# Patient Record
Sex: Female | Born: 1965 | Hispanic: No | Marital: Single | Smoking: Current every day smoker
Health system: Southern US, Community
[De-identification: ages and names within clinical notes are randomized; demographics above are authoritative.]

## PROBLEM LIST (undated history)

## (undated) DIAGNOSIS — K449 Diaphragmatic hernia without obstruction or gangrene: Secondary | ICD-10-CM

## (undated) DIAGNOSIS — F419 Anxiety disorder, unspecified: Secondary | ICD-10-CM

## (undated) DIAGNOSIS — F32A Depression, unspecified: Secondary | ICD-10-CM

## (undated) DIAGNOSIS — K219 Gastro-esophageal reflux disease without esophagitis: Secondary | ICD-10-CM

## (undated) DIAGNOSIS — E785 Hyperlipidemia, unspecified: Secondary | ICD-10-CM

## (undated) HISTORY — DX: Hyperlipidemia, unspecified: E78.5

## (undated) HISTORY — DX: Gastro-esophageal reflux disease without esophagitis: K21.9

## (undated) HISTORY — PX: INCONTINENCE SURGERY: SHX676

## (undated) HISTORY — DX: Anxiety disorder, unspecified: F41.9

## (undated) HISTORY — PX: TONSILLECTOMY: SUR1361

## (undated) HISTORY — DX: Depression, unspecified: F32.A

## (undated) HISTORY — PX: OTHER SURGICAL HISTORY: SHX169

---

## 1998-09-07 ENCOUNTER — Other Ambulatory Visit: Admission: RE | Admit: 1998-09-07 | Discharge: 1998-09-07 | Payer: Self-pay | Admitting: Otolaryngology

## 2000-12-26 ENCOUNTER — Encounter (INDEPENDENT_AMBULATORY_CARE_PROVIDER_SITE_OTHER): Payer: Self-pay | Admitting: Specialist

## 2000-12-26 ENCOUNTER — Other Ambulatory Visit: Admission: RE | Admit: 2000-12-26 | Discharge: 2000-12-26 | Payer: Self-pay | Admitting: Otolaryngology

## 2001-04-09 ENCOUNTER — Emergency Department (HOSPITAL_COMMUNITY): Admission: EM | Admit: 2001-04-09 | Discharge: 2001-04-10 | Payer: Self-pay | Admitting: *Deleted

## 2001-07-15 ENCOUNTER — Emergency Department (HOSPITAL_COMMUNITY): Admission: EM | Admit: 2001-07-15 | Discharge: 2001-07-15 | Payer: Self-pay | Admitting: Emergency Medicine

## 2001-08-14 ENCOUNTER — Other Ambulatory Visit: Admission: RE | Admit: 2001-08-14 | Discharge: 2001-08-14 | Payer: Self-pay | Admitting: Internal Medicine

## 2002-11-06 ENCOUNTER — Encounter: Admission: RE | Admit: 2002-11-06 | Discharge: 2002-11-06 | Payer: Self-pay | Admitting: Internal Medicine

## 2002-11-06 ENCOUNTER — Encounter: Payer: Self-pay | Admitting: Internal Medicine

## 2002-12-09 ENCOUNTER — Ambulatory Visit (HOSPITAL_COMMUNITY): Admission: RE | Admit: 2002-12-09 | Discharge: 2002-12-09 | Payer: Self-pay | Admitting: Internal Medicine

## 2002-12-09 ENCOUNTER — Encounter: Payer: Self-pay | Admitting: Internal Medicine

## 2003-08-04 ENCOUNTER — Ambulatory Visit (HOSPITAL_COMMUNITY): Admission: RE | Admit: 2003-08-04 | Discharge: 2003-08-04 | Payer: Self-pay | Admitting: Internal Medicine

## 2004-04-07 ENCOUNTER — Emergency Department (HOSPITAL_COMMUNITY): Admission: EM | Admit: 2004-04-07 | Discharge: 2004-04-07 | Payer: Self-pay | Admitting: Emergency Medicine

## 2005-05-15 ENCOUNTER — Emergency Department (HOSPITAL_COMMUNITY): Admission: EM | Admit: 2005-05-15 | Discharge: 2005-05-16 | Payer: Self-pay | Admitting: Emergency Medicine

## 2006-02-14 ENCOUNTER — Emergency Department (HOSPITAL_COMMUNITY): Admission: RE | Admit: 2006-02-14 | Discharge: 2006-02-14 | Payer: Self-pay | Admitting: Preventative Medicine

## 2007-09-23 ENCOUNTER — Ambulatory Visit (HOSPITAL_COMMUNITY): Admission: RE | Admit: 2007-09-23 | Discharge: 2007-09-23 | Payer: Self-pay | Admitting: Family Medicine

## 2007-09-25 ENCOUNTER — Ambulatory Visit (HOSPITAL_COMMUNITY): Admission: RE | Admit: 2007-09-25 | Discharge: 2007-09-25 | Payer: Self-pay | Admitting: Family Medicine

## 2008-08-21 ENCOUNTER — Emergency Department (HOSPITAL_COMMUNITY): Admission: EM | Admit: 2008-08-21 | Discharge: 2008-08-21 | Payer: Self-pay | Admitting: Emergency Medicine

## 2009-08-22 ENCOUNTER — Ambulatory Visit (HOSPITAL_COMMUNITY): Admission: RE | Admit: 2009-08-22 | Discharge: 2009-08-22 | Payer: Self-pay | Admitting: Internal Medicine

## 2009-10-04 ENCOUNTER — Ambulatory Visit (HOSPITAL_COMMUNITY): Admission: RE | Admit: 2009-10-04 | Discharge: 2009-10-04 | Payer: Self-pay | Admitting: Obstetrics and Gynecology

## 2009-12-27 ENCOUNTER — Emergency Department (HOSPITAL_COMMUNITY): Admission: EM | Admit: 2009-12-27 | Discharge: 2009-12-27 | Payer: Self-pay | Admitting: Emergency Medicine

## 2010-11-16 LAB — CBC
HCT: 40 % (ref 36.0–46.0)
Hemoglobin: 12.9 g/dL (ref 12.0–15.0)
MCHC: 32.4 g/dL (ref 30.0–36.0)
MCV: 75.5 fL — ABNORMAL LOW (ref 78.0–100.0)
Platelets: 387 10*3/uL (ref 150–400)
RBC: 5.3 MIL/uL — ABNORMAL HIGH (ref 3.87–5.11)
RDW: 16.5 % — ABNORMAL HIGH (ref 11.5–15.5)
WBC: 12.5 10*3/uL — ABNORMAL HIGH (ref 4.0–10.5)

## 2010-11-16 LAB — PREGNANCY, URINE: Preg Test, Ur: NEGATIVE

## 2011-06-24 ENCOUNTER — Other Ambulatory Visit: Payer: Self-pay

## 2011-06-24 ENCOUNTER — Emergency Department (HOSPITAL_COMMUNITY): Payer: Self-pay

## 2011-06-24 ENCOUNTER — Encounter: Payer: Self-pay | Admitting: *Deleted

## 2011-06-24 ENCOUNTER — Emergency Department (HOSPITAL_COMMUNITY)
Admission: EM | Admit: 2011-06-24 | Discharge: 2011-06-24 | Disposition: A | Discharge status: Home / Self Care | Payer: Self-pay | Attending: Emergency Medicine | Admitting: Emergency Medicine

## 2011-06-24 DIAGNOSIS — S5420XA Injury of radial nerve at forearm level, unspecified arm, initial encounter: Secondary | ICD-10-CM | POA: Insufficient documentation

## 2011-06-24 DIAGNOSIS — G43909 Migraine, unspecified, not intractable, without status migrainosus: Secondary | ICD-10-CM | POA: Insufficient documentation

## 2011-06-24 DIAGNOSIS — X58XXXA Exposure to other specified factors, initial encounter: Secondary | ICD-10-CM | POA: Insufficient documentation

## 2011-06-24 DIAGNOSIS — F17200 Nicotine dependence, unspecified, uncomplicated: Secondary | ICD-10-CM | POA: Insufficient documentation

## 2011-06-24 DIAGNOSIS — R000 Tachycardia, unspecified: Secondary | ICD-10-CM | POA: Insufficient documentation

## 2011-06-24 DIAGNOSIS — S42309A Unspecified fracture of shaft of humerus, unspecified arm, initial encounter for closed fracture: Secondary | ICD-10-CM | POA: Insufficient documentation

## 2011-06-24 DIAGNOSIS — K449 Diaphragmatic hernia without obstruction or gangrene: Secondary | ICD-10-CM | POA: Insufficient documentation

## 2011-06-24 HISTORY — DX: Diaphragmatic hernia without obstruction or gangrene: K44.9

## 2011-06-24 MED ORDER — SODIUM CHLORIDE 0.9 % IV SOLN
Freq: Once | INTRAVENOUS | Status: AC
  Administered 2011-06-24: 1000 mL via INTRAVENOUS

## 2011-06-24 MED ORDER — HYDROMORPHONE HCL 1 MG/ML IJ SOLN
1.0000 mg | Freq: Once | INTRAMUSCULAR | Status: AC
  Administered 2011-06-24: 1 mg via INTRAVENOUS

## 2011-06-24 MED ORDER — HYDROMORPHONE HCL 4 MG PO TABS: 4.0000 mg | ORAL_TABLET | ORAL | Status: DC | PRN

## 2011-06-24 MED ORDER — HYDROMORPHONE HCL 1 MG/ML IJ SOLN
INTRAMUSCULAR | Status: AC
  Administered 2011-06-24: 1 mg via INTRAVENOUS
  Filled 2011-06-24: qty 1

## 2011-06-24 MED ORDER — ONDANSETRON HCL 4 MG/2ML IJ SOLN
4.0000 mg | Freq: Once | INTRAMUSCULAR | Status: AC
  Administered 2011-06-24: 4 mg via INTRAVENOUS

## 2011-06-24 MED ORDER — ONDANSETRON HCL 4 MG/2ML IJ SOLN
INTRAMUSCULAR | Status: AC
  Filled 2011-06-24: qty 2

## 2011-06-24 MED ORDER — HYDROMORPHONE HCL 2 MG/ML IJ SOLN
INTRAMUSCULAR | Status: AC
  Administered 2011-06-24: 2 mg via INTRAVENOUS
  Filled 2011-06-24: qty 1

## 2011-06-24 NOTE — ED Notes (Signed)
Up to bathroom with assist. Pt tolerated well. Dressed in pt gown and paper scrub pants. Pulse rate remains high. Will infuse remainder of ns liter prior to discharge

## 2011-06-24 NOTE — ED Notes (Signed)
Family at bedside.pulses remain pos left wrist. Fresh ice bag applied to upper arm. Pt now more comfortable s/p dilaudid 2mg 

## 2011-06-24 NOTE — ED Notes (Signed)
Pt reports she had a coughing episode and "passed" out, pt fell injuring left arm, obvious deformity to left upper arm noted, PMS intact

## 2011-06-24 NOTE — ED Notes (Signed)
Ice has been applied to arm upon admission and remains on now. Pt has been undressed with minimal movement of arm. Pulses remain intact. Sensation good in fingers. Still unable to extend wrist. Per ERMD will wait to splint until consult with ortho.

## 2011-06-24 NOTE — ED Notes (Signed)
Fairly comfortable sitting in wc, groggy s/p medications. Pulse rate decreased to 120's and BP stable. Circ/sensation good in all fingers. Pulses pos in wrist. Pt ready to go home. Given detailed written and verbal instructions, will be staying @ her mom's home today. Will fill rx this morning. Will call ortho on mon morning.

## 2011-06-24 NOTE — ED Notes (Addendum)
Iv infusing well, pulse rate 127. Pt sitting in w/c fairly comfortable.

## 2011-06-24 NOTE — ED Provider Notes (Addendum)
History     CSN: [id number] Arrival date & time: 06/24/2011 12:59 AM   First MD Initiated Contact with Patient 06/24/11 0101      Chief Complaint  Patient presents with  . Arm Injury    (Consider location/radiation/quality/duration/timing/severity/associated sxs/prior treatment) HPI This is a 45 year old female with a history of cough-induced vagal syncope. She had a typical episode occur at a Halloween party just prior to arrival. When she fell she injured her left upper arm. There is a deformity and pseudarthrosis of the left upper arm with moderate to severe pain. The pain is worse with movement or palpation. She also has inability to extend the left hand or fingers. She denies other injury.  Past Medical History  Diagnosis Date  . Migraine   . Hiatal hernia     Past Surgical History  Procedure Date  . Tonsillectomy     No family history on file.  History  Substance Use Topics  . Smoking status: Current Everyday Smoker  . Smokeless tobacco: Not on file  . Alcohol Use: Yes    OB History    Grav Para Term Preterm Abortions TAB SAB Ect Mult Living                  Review of Systems  All other systems reviewed and are negative.    Allergies  Review of patient's allergies indicates no known allergies.  Home Medications   Current Outpatient Rx  Name Route Sig Dispense Refill  . CELEXA PO Oral Take by mouth.      . OMEPRAZOLE 40 MG PO CPDR Oral Take 40 mg by mouth daily.        BP 98/57  Pulse 108  Temp(Src) 98.1 F (36.7 C) (Oral)  Resp 14  Ht 5\' 4"  (1.626 m)  Wt 190 lb (86.183 kg)  BMI 32.61 kg/m2  SpO2 95%  LMP 06/24/2011  Physical Exam General: Well-developed, well-nourished female in no acute distress; appearance consistent with age of record HENT: normocephalic, atraumatic Eyes: Normal Neck: supple Heart: regular rate and rhythm; tachycardic Lungs: clear to auscultation bilaterally Abdomen: soft; nontender; nondistended Extremities:  Deformity and pseudarthrosis of the left upper arm consistent with an humeral shaft fracture; left upper extremity radial and ulnar pulses are +2; sensation is intact over the left forearm and hand with subjective paresthesias dorsally; patient able to grip with the left hand but unable to extend the fingers of the left hand or the left hand at the wrist Neurologic: Awake, alert and oriented;motor function intact in all extremities and symmetric excepting the radial nerve deficit noted above; no facial droop Skin: Warm and dry Psychiatric: Normal mood and affect    ED Course  SPLINT APPLICATION Date/Time: 06/24/2011 3:31 AM Performed by: Hanley Seamen Authorized by: Hanley Seamen Location: left upper arm. Splint type: coaptation. Supplies used: cotton padding, elastic bandage and Ortho-Glass Post-procedure: The splinted body part was neurovascularly unchanged following the procedure. Patient tolerance: Patient tolerated the procedure well with no immediate complications.   (including critical care time)    MDM   EKG Interpretation:  Date & Time: 06/24/2011 1:16 AM  Rate: 117  Rhythm: sinus tachycardia  QRS Axis: normal  Intervals: normal  ST/T Wave abnormalities: normal  Conduction Disutrbances:none  Narrative Interpretation:   Old EKG Reviewed: none available  Nursing notes and vitals signs, including pulse oximetry, reviewed.  Summary of this visit's results, reviewed by myself:   Imaging Studies: Dg Humerus Left  06/24/2011  *  RADIOLOGY REPORT*  Clinical Data: Larey Seat and injured left upper arm.  PORTABLE LEFT HUMERUS - 2+ VIEW 06/24/2011:  Comparison: None.  Findings: Comminuted fracture involving the proximal metadiaphysis with slight medial displacement the distal fragment.  Visualized shoulder joint and elbow joint intact.  IMPRESSION: Comminuted fracture involving the proximal humeral metadiaphysis.  Original Report Authenticated By: Arnell Sieving, M.D.   4:53  AM Patient getting second liter of normal saline due to tachycardia. Patient's blood pressure has been within normal limits. The patient's care was discussed with Dr. Victorino Dike of [facility] who will see her in his office tomorrow.            Hanley Seamen, MD 06/24/11 0454  Hanley Seamen, MD 06/24/11 [phone]

## 2011-06-26 ENCOUNTER — Encounter (HOSPITAL_COMMUNITY): Payer: Self-pay | Admitting: Pharmacy Technician

## 2011-06-27 ENCOUNTER — Encounter (HOSPITAL_COMMUNITY)
Admission: RE | Admit: 2011-06-27 | Discharge: 2011-06-27 | Disposition: A | Discharge status: Home / Self Care | Payer: Self-pay | Source: Ambulatory Visit | Attending: Orthopedic Surgery | Admitting: Orthopedic Surgery

## 2011-06-27 ENCOUNTER — Other Ambulatory Visit (HOSPITAL_COMMUNITY): Payer: Self-pay | Admitting: Orthopedic Surgery

## 2011-06-27 ENCOUNTER — Ambulatory Visit (HOSPITAL_COMMUNITY)
Admission: RE | Admit: 2011-06-27 | Discharge: 2011-06-27 | Disposition: A | Discharge status: Home / Self Care | Payer: Self-pay | Source: Ambulatory Visit | Attending: Orthopedic Surgery | Admitting: Orthopedic Surgery

## 2011-06-27 DIAGNOSIS — S42309A Unspecified fracture of shaft of humerus, unspecified arm, initial encounter for closed fracture: Secondary | ICD-10-CM

## 2011-06-27 DIAGNOSIS — Z01818 Encounter for other preprocedural examination: Secondary | ICD-10-CM | POA: Insufficient documentation

## 2011-06-27 DIAGNOSIS — Z01812 Encounter for preprocedural laboratory examination: Secondary | ICD-10-CM | POA: Insufficient documentation

## 2011-06-27 LAB — COMPREHENSIVE METABOLIC PANEL
ALT: 15 U/L (ref 0–35)
Albumin: 3.4 g/dL — ABNORMAL LOW (ref 3.5–5.2)
Alkaline Phosphatase: 66 U/L (ref 39–117)
BUN: 9 mg/dL (ref 6–23)
Calcium: 9.4 mg/dL (ref 8.4–10.5)
Potassium: 3.6 mEq/L (ref 3.5–5.1)
Sodium: 139 mEq/L (ref 135–145)
Total Protein: 7 g/dL (ref 6.0–8.3)

## 2011-06-27 LAB — CBC
MCHC: 30.8 g/dL (ref 30.0–36.0)
Platelets: 352 10*3/uL (ref 150–400)
RDW: 15 % (ref 11.5–15.5)
WBC: 6.6 10*3/uL (ref 4.0–10.5)

## 2011-06-27 LAB — DIFFERENTIAL
Basophils Absolute: 0.1 10*3/uL (ref 0.0–0.1)
Basophils Relative: 1 % (ref 0–1)
Eosinophils Absolute: 0.3 10*3/uL (ref 0.0–0.7)
Eosinophils Relative: 5 % (ref 0–5)

## 2011-06-27 LAB — SURGICAL PCR SCREEN: MRSA, PCR: NEGATIVE

## 2011-06-30 ENCOUNTER — Ambulatory Visit (HOSPITAL_COMMUNITY)
Admission: RE | Admit: 2011-06-30 | Discharge: 2011-07-01 | Disposition: A | Discharge status: Home / Self Care | Payer: Self-pay | Source: Ambulatory Visit | Attending: Orthopedic Surgery | Admitting: Orthopedic Surgery

## 2011-06-30 DIAGNOSIS — Z87891 Personal history of nicotine dependence: Secondary | ICD-10-CM | POA: Insufficient documentation

## 2011-06-30 DIAGNOSIS — S5420XA Injury of radial nerve at forearm level, unspecified arm, initial encounter: Secondary | ICD-10-CM | POA: Insufficient documentation

## 2011-06-30 DIAGNOSIS — S42309A Unspecified fracture of shaft of humerus, unspecified arm, initial encounter for closed fracture: Secondary | ICD-10-CM | POA: Insufficient documentation

## 2011-06-30 DIAGNOSIS — W19XXXA Unspecified fall, initial encounter: Secondary | ICD-10-CM | POA: Insufficient documentation

## 2011-06-30 DIAGNOSIS — G43909 Migraine, unspecified, not intractable, without status migrainosus: Secondary | ICD-10-CM

## 2011-06-30 DIAGNOSIS — Z01818 Encounter for other preprocedural examination: Secondary | ICD-10-CM | POA: Insufficient documentation

## 2011-06-30 DIAGNOSIS — J449 Chronic obstructive pulmonary disease, unspecified: Secondary | ICD-10-CM | POA: Insufficient documentation

## 2011-06-30 DIAGNOSIS — J4489 Other specified chronic obstructive pulmonary disease: Secondary | ICD-10-CM | POA: Insufficient documentation

## 2011-06-30 DIAGNOSIS — E669 Obesity, unspecified: Secondary | ICD-10-CM | POA: Insufficient documentation

## 2011-06-30 DIAGNOSIS — K449 Diaphragmatic hernia without obstruction or gangrene: Secondary | ICD-10-CM

## 2011-06-30 DIAGNOSIS — F17200 Nicotine dependence, unspecified, uncomplicated: Secondary | ICD-10-CM

## 2011-06-30 DIAGNOSIS — Z01812 Encounter for preprocedural laboratory examination: Secondary | ICD-10-CM | POA: Insufficient documentation

## 2011-06-30 MED ORDER — CITALOPRAM HYDROBROMIDE 20 MG PO TABS
20.0000 mg | ORAL_TABLET | Freq: Every day | ORAL | Status: DC
  Administered 2011-07-01: 20 mg via ORAL
  Filled 2011-06-30: qty 1

## 2011-06-30 MED ORDER — VITAMIN C 500 MG PO TABS
500.0000 mg | ORAL_TABLET | Freq: Every day | ORAL | Status: DC
  Administered 2011-07-01: 500 mg via ORAL
  Filled 2011-06-30: qty 1

## 2011-06-30 MED ORDER — LACTATED RINGERS IV SOLN
INTRAVENOUS | Status: DC
  Administered 2011-07-01: 02:00:00 via INTRAVENOUS

## 2011-06-30 MED ORDER — LORATADINE 10 MG PO TABS
10.0000 mg | ORAL_TABLET | Freq: Every day | ORAL | Status: DC
  Administered 2011-07-01: 10 mg via ORAL
  Filled 2011-06-30: qty 1

## 2011-06-30 MED ORDER — DIPHENHYDRAMINE HCL 25 MG PO CAPS: 25.0000 mg | ORAL_CAPSULE | ORAL | Status: DC | PRN

## 2011-06-30 MED ORDER — HYDROMORPHONE HCL 2 MG PO TABS
2.0000 mg | ORAL_TABLET | ORAL | Status: DC | PRN
  Administered 2011-07-01: 4 mg via ORAL

## 2011-06-30 MED ORDER — ONDANSETRON HCL 4 MG/2ML IJ SOLN: 4.0000 mg | Freq: Four times a day (QID) | INTRAMUSCULAR | Status: DC | PRN

## 2011-06-30 MED ORDER — DOCUSATE SODIUM 100 MG PO CAPS
100.0000 mg | ORAL_CAPSULE | Freq: Two times a day (BID) | ORAL | Status: DC
  Administered 2011-07-01: 100 mg via ORAL

## 2011-06-30 MED ORDER — METHOCARBAMOL 500 MG PO TABS
500.0000 mg | ORAL_TABLET | Freq: Four times a day (QID) | ORAL | Status: DC | PRN
  Administered 2011-07-01: 500 mg via ORAL

## 2011-06-30 MED ORDER — ONDANSETRON HCL 4 MG PO TABS: 8.0000 mg | ORAL_TABLET | Freq: Four times a day (QID) | ORAL | Status: DC | PRN

## 2011-06-30 MED ORDER — HYDROMORPHONE HCL PF 1 MG/ML IJ SOLN: 0.5000 mg | INTRAMUSCULAR | Status: DC | PRN

## 2011-06-30 MED ORDER — CEFAZOLIN SODIUM 1-5 GM-% IV SOLN
1.0000 g | Freq: Three times a day (TID) | INTRAVENOUS | Status: DC
  Administered 2011-07-01: 1 g via INTRAVENOUS
  Filled 2011-06-30 (×3): qty 50

## 2011-06-30 MED ORDER — PANTOPRAZOLE SODIUM 40 MG PO TBEC
40.0000 mg | DELAYED_RELEASE_TABLET | Freq: Every day | ORAL | Status: DC
  Administered 2011-07-01: 40 mg via ORAL

## 2011-06-30 MED ORDER — MORPHINE SULFATE 2 MG/ML IJ SOLN: 2.0000 mg | INTRAMUSCULAR | Status: DC | PRN

## 2011-06-30 MED ORDER — METHOCARBAMOL 100 MG/ML IJ SOLN
500.0000 mg | Freq: Four times a day (QID) | INTRAVENOUS | Status: DC | PRN
  Filled 2011-06-30: qty 5

## 2011-06-30 MED ORDER — OXYCODONE HCL 5 MG PO TABS
5.0000 mg | ORAL_TABLET | ORAL | Status: DC | PRN
  Administered 2011-07-01 (×2): 10 mg via ORAL

## 2011-07-01 MED ORDER — HYDROMORPHONE HCL 2 MG PO TABS: 2.0000 mg | ORAL_TABLET | ORAL | Status: DC | PRN

## 2011-07-01 MED ORDER — HYDROMORPHONE HCL 2 MG PO TABS: 2.0000 mg | ORAL_TABLET | ORAL | Status: AC | PRN

## 2011-07-01 MED ORDER — METHOCARBAMOL 500 MG PO TABS: 500.0000 mg | ORAL_TABLET | Freq: Four times a day (QID) | ORAL | Status: AC

## 2011-07-01 NOTE — Progress Notes (Signed)
Orthopedic Tech Progress Note Patient Details:  Tammie Sweeney Mar 10, 1966 [id number]  Other Ortho Devices Type of Ortho Device: Other (comment) Ortho Device Location: shoulder immobilizer sling Ortho Device Interventions: Application   [name] 07/01/2011, 2:16 PM

## 2011-07-01 NOTE — Progress Notes (Signed)
No PT needs.  Pt is independent with mobility.  OT to address all needs.  PT sign off.

## 2011-07-01 NOTE — Progress Notes (Signed)
Occupational Therapy Evaluation Patient Details Name: Tammie Sweeney MRN: [id number] DOB: 04-Jan-1966 Today's Date: 07/01/2011  Problem List: There is no problem list on file for this patient.   Past Medical History:  Past Medical History  Diagnosis Date  . Migraine   . Hiatal hernia    Past Surgical History:  Past Surgical History  Procedure Date  . Tonsillectomy     OT Assessment/Plan/Recommendation OT Assessment OT Recommendation/Assessment: Patient does not need any further OT services (Progress with OT per MD after follow up appt. with MD) OT Goals    OT Evaluation Precautions/Restrictions  Precautions Precautions: Shoulder Type of Shoulder Precautions: Sling with ambulation, ADLs, Only AROM fingers Precaution Booklet Issued: No Precaution Comments: ADLs, Sling with ambulation, AROM fingers only, elevation of left UE on 1-3 pillows. Required Braces or Orthoses: Yes Other Brace/Splint: Sling Restrictions Weight Bearing Restrictions: Yes LUE Weight Bearing: Non weight bearing Other Position/Activity Restrictions: Sling with ambulation and PRN Prior Functioning Home Living Lives With: Family Receives Help From: Family Type of Home: House Home Layout: One level Home Access: Stairs to enter Secretary/administrator of Steps: 3 Bathroom Shower/Tub: Forensic scientist: Standard Bathroom Accessibility: No Prior Function Level of Independence: Independent with basic ADLs Able to Take Stairs?: Yes Driving: Yes Vocation: Full time employment ADL ADL Eating/Feeding: Simulated;Set up Where Assessed - Eating/Feeding: Edge of bed Grooming: Performed;Wash/dry hands;Set up Where Assessed - Grooming: Standing at sink Upper Body Bathing: Not assessed Lower Body Bathing: Other (comment) ([email or web address] on LB AE of long handled sponge ) Where Assessed - Lower Body Bathing: Sit to stand from bed Upper Body Dressing: Simulated;Set up (Educated on don LUE  sleeve first ) Upper Body Dressing Details (indicate cue type and reason): Mod verbal cues for technique and demonstration Where Assessed - Upper Body Dressing: Unsupported Lower Body Dressing: Simulated;Minimal assistance (Educated and demonstrated on AE use of reacher and sock aide) Lower Body Dressing Details (indicate cue type and reason): Mod verbal cues of technique  Where Assessed - Lower Body Dressing: Sit to stand from bed Toilet Transfer: Performed;Modified independent Toilet Transfer Method: Proofreader: Regular height toilet Toileting - Clothing Manipulation: Simulated;Minimal assistance Where Assessed - Toileting Clothing Manipulation: Sit to stand from 3-in-1 or toilet Toileting - Hygiene: Simulated;Set up (Pt. with increased time to complete hygiene) Toileting - Hygiene Details (indicate cue type and reason): Min verbal cues for technique of using aide to reach backside to perform hygiene Where Assessed - Toileting Hygiene: Sit to stand from 3-in-1 or toilet Tub/Shower Transfer: Not assessed (Pt. educated and demonstrated the technique for transfer) Tub/Shower Transfer Method: Not assessed Psychologist, educational:  (None) Equipment Used: Reacher;Long-handled sponge;Sock aid;Upper extremity splints (LUE wrapped splint ) ADL Comments: Educated pt. on use of plastic wrapping to cover LUE while bathing to maintain coverage and integrity of splint dressings Vision/Perception  Vision - History Baseline Vision: No visual deficits Patient Visual Report: No change from baseline Vision - Assessment Vision Assessment: Vision not tested Perception Perception: Not tested Praxis Praxis: Not tested Cognition Cognition Arousal/Alertness: Awake/alert Overall Cognitive Status: Appears within functional limits for tasks assessed Sensation/Coordination Sensation Light Touch: Not tested Stereognosis: Not tested Hot/Cold: Not tested Proprioception: Not  tested Coordination Gross Motor Movements are Fluid and Coordinated: Yes Fine Motor Movements are Fluid and Coordinated: No (LUE digits with increased swelling and decreased fine motor) Extremity Assessment RUE Assessment RUE Assessment: Within Functional Limits LUE Assessment LUE Assessment: Exceptions to WFL (Only AAROM/AROM of digits to decrease  swelling) Mobility  Bed Mobility Bed Mobility: Yes Supine to Sit: 6: Modified independent (Device/Increase time) (Increased time) Transfers Transfers: Yes Sit to Stand: 6: Modified independent (Device/Increase time) (Increase time) Exercises Hand Exercises Digit Composite Flexion: AROM;10 reps Composite Extension: AAROM;Self ROM (To achieve full extension) Digit Composite Abduction: AROM;10 reps Digit Composite Adduction: AROM;10 reps;Supine Digit Lifts: AAROM;Self ROM;Left;10 reps;Supine Thumb Abduction: AROM;Left;10 reps;Supine Thumb Adduction: AROM;Left;10 reps;Supine Opposition: AROM;10 reps;Supine -All exercises for LUE hand. End of Session OT - End of Session Equipment Utilized During Treatment: Gait belt (LUE sling) Activity Tolerance: Patient tolerated treatment well Patient left: in bed Nurse Communication: Mobility status for transfers;Mobility status for ambulation General Behavior During Session: [facility] for tasks performed Cognition: [facility] for tasks performed   Ayeisha Lindenberger 07/01/2011, 10:12 AM

## 2011-07-01 NOTE — Discharge Summary (Signed)
  See dictation [phone]

## 2011-07-02 NOTE — Discharge Summary (Signed)
NAMEMERCED, BROUGHAM                 ACCOUNT NO.:  [id number]  MEDICAL RECORD NO.:  [id number]  LOCATION:  5019                         FACILITY:  [facility]  PHYSICIAN:  Dionne Ano. Reneka Nebergall, M.D.DATE OF BIRTH:  1965/11/25  DATE OF ADMISSION:  06/30/2011 DATE OF DISCHARGE:  07/01/2011                              DISCHARGE SUMMARY   Tammie Sweeney was admitted to the hospital on June 30, 2011, for open reduction and internal fixation for her left displaced comminuted humerus fracture.  She was taken to the operative arena on that day. Preoperatively, she was counseled in regard to risks and benefits.  She underwent successful open reduction and internal fixation and radial nerve decompression and neurolysis.  Her radial nerve was highly contused and intact and she did very well.  Postoperatively, she was matriculated in long-hour standard post-op algorithm.  Postop day 1, she was awake, alert, and oriented, vital signs are stable.  She had no complicating features.  She had a radial nerve palsy, we are going to watch this very closely.  She understands that it might take 6 months to a year for this to return; however, given the state of affairs, the nerve and her early return to function, I do think it is going to come back sooner and so does [name].  We are going to ask her maintain healthy living lifestyle and no smoking, see me in the office as her discharge summary outlines.  At the time of discharge, she was awake, alert, and oriented, in no acute distress.  Vital signs are stable.  She had equal chest expansion.  No shortness of breath.  Abdomen nontender.  Walking with a nonantalgic gait.  Left upper extremity was stable.  There were no complicating features.  We will look forward to seeing her back.     Dionne Ano. Amanda Pea, M.D.     [facility]  D:  07/01/2011  T:  07/01/2011  Job:  [id number]

## 2011-07-03 MED FILL — Chlorhexidine Gluconate Liquid 4%: CUTANEOUS | Qty: 118 | Status: AC

## 2011-07-03 MED FILL — Cefazolin Sodium For Inj 1 GM: INTRAMUSCULAR | Qty: 20 | Status: AC

## 2011-07-03 MED FILL — Dextrose Inj 5%: INTRAVENOUS | Qty: 50 | Status: AC

## 2011-07-31 ENCOUNTER — Ambulatory Visit (HOSPITAL_COMMUNITY): Payer: Self-pay | Admitting: Physical Therapy

## 2011-08-02 ENCOUNTER — Ambulatory Visit (HOSPITAL_COMMUNITY)
Admission: RE | Admit: 2011-08-02 | Discharge: 2011-08-02 | Disposition: A | Discharge status: Home / Self Care | Payer: Self-pay | Source: Ambulatory Visit | Attending: Orthopedic Surgery | Admitting: Orthopedic Surgery

## 2011-08-02 DIAGNOSIS — M25539 Pain in unspecified wrist: Secondary | ICD-10-CM | POA: Insufficient documentation

## 2011-08-02 DIAGNOSIS — S42309A Unspecified fracture of shaft of humerus, unspecified arm, initial encounter for closed fracture: Secondary | ICD-10-CM

## 2011-08-02 DIAGNOSIS — IMO0001 Reserved for inherently not codable concepts without codable children: Secondary | ICD-10-CM | POA: Insufficient documentation

## 2011-08-02 DIAGNOSIS — M25639 Stiffness of unspecified wrist, not elsewhere classified: Secondary | ICD-10-CM | POA: Insufficient documentation

## 2011-08-02 DIAGNOSIS — G5630 Lesion of radial nerve, unspecified upper limb: Secondary | ICD-10-CM | POA: Insufficient documentation

## 2011-08-02 DIAGNOSIS — Z4689 Encounter for fitting and adjustment of other specified devices: Secondary | ICD-10-CM | POA: Insufficient documentation

## 2011-08-02 DIAGNOSIS — M6281 Muscle weakness (generalized): Secondary | ICD-10-CM | POA: Insufficient documentation

## 2011-08-02 HISTORY — DX: Unspecified fracture of shaft of humerus, unspecified arm, initial encounter for closed fracture: S42.309A

## 2011-08-02 NOTE — Progress Notes (Signed)
Occupational Therapy Evaluation  Patient Details  Name: Tammie Sweeney MRN: [id number] Date of Birth: 09/17/1965  Today's Date: 08/02/2011 Time: 4098-1191 Time Calculation (min): 59 min OT Eval 4782-9562 10' Splinting 1308-6578 48' Visit#: 1  of 4   Re-eval: 08/30/11  Assessment Diagnosis: S/P R Humeral Shaft ORIF and Radial Nerve Palsy Surgical Date: 06/30/11 Next MD Visit: 08/17/11  Past Medical History:  Past Medical History  Diagnosis Date  . Migraine   . Hiatal hernia    Past Surgical History:  Past Surgical History  Procedure Date  . Tonsillectomy     Subjective Symptoms/Limitations Symptoms: S:  I broke my arm and had to have surgery.   Limitations: Tammie Sweeney fell and fractured her right humeral shaft on 06/23/11.  She had surgery the following week to repair the fracture and has been in a soft cast since that time.  She has been referred to occupational therapy for fabrication of a radial nerve splint and a sarmiento brace for her upper arm.  She returns to the MD mid month, and will determine if actual  treatment for range and function can begin at t that point. Special Tests: comment is left upper extremity, not right  Precautions/Restrictions  Precautions Precautions: Shoulder   Assessment RUE Assessment RUE Assessment: Within Functional Limits RUE PROM (degrees) Overall PROM Right Upper Extremity:  (forearm, wrist, hand PROM is WFL.  AROM flex WFL 0 ext)  Exercise/Treatments  Splinting:   began fabrication of left radial nerve splint with phoenix outrigger, to include thumb.  Base is fabricated, and digits 2-5 initiated.  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 Rehab Potential: Good OT Frequency: Min 2X/week OT Duration: 2 weeks OT Treatment/Interventions:  Self-care/ADL training;Patient/family education;Other (comment) (HEP and splinting) OT Plan: P:  Complete radial nerve splint (thumb and digit placement)  measure for sarmiento brace and fit.   Goals Short Term Goals Time to Complete Short Term Goals: 2 weeks Short Term Goal 1: Patient will be educated on wear and care of radial nerve splint and sarmiento brace. Short Term Goal 2: Patient will be I with donning and doffing radial nerve splint and sarmiento brace. End of Session Patient Active Problem List  Diagnoses  . Fracture closed, humerus, shaft  . Radial nerve palsy   End of Session Activity Tolerance: Patient tolerated treatment well General Behavior During Session: [facility] for tasks performed Cognition: [facility] for tasks performed  Time Calculation Start Time: 1526 Stop Time: 1625 Time Calculation (min): 59 min  Shirlean Mylar, OTR/L  08/02/2011, 5:10 PM  Physician Documentation Your signature is required to indicate approval of the treatment plan as stated above.  Please sign and either send electronically or make a copy of this report for your files and return this physician signed original.  Please mark one 1.__approve of plan  2. ___approve of plan with the following conditions.   ______________________________                                                          _____________________ Physician Signature  Date  

## 2011-08-03 ENCOUNTER — Ambulatory Visit (HOSPITAL_COMMUNITY)
Admission: RE | Admit: 2011-08-03 | Discharge: 2011-08-03 | Disposition: A | Discharge status: Home / Self Care | Payer: Self-pay | Source: Ambulatory Visit | Attending: Internal Medicine | Admitting: Internal Medicine

## 2011-08-03 DIAGNOSIS — S42309A Unspecified fracture of shaft of humerus, unspecified arm, initial encounter for closed fracture: Secondary | ICD-10-CM

## 2011-08-03 DIAGNOSIS — G5630 Lesion of radial nerve, unspecified upper limb: Secondary | ICD-10-CM

## 2011-08-03 NOTE — Progress Notes (Signed)
Occupational Therapy Treatment  Patient Details  Name: Tammie Sweeney MRN: [id number] Date of Birth: [birth year]  Today's Date: 08/03/2011 Time: 0865-7846 Time Calculation (min): 35 min Splinting 35' Visit#: 2  of 4   Re-eval: 08/29/10    Subjective Symptoms/Limitations Symptoms: S:  This feels fine. Pain Assessment Currently in Pain?: No/denies  Exercise/Treatments Splinting:  Completed fabrication of left radial nerve splint with phoenix outrigger including thumb.  Patient demonstrated I donning and doffing the splint and was able to grasp her book, her phone, and  a cup with her hand  in the splint.  I instructed her to wear the splint as much as possible during the day and to sleep in the previously issued spliint or a store bought wrist cock up spliint.  I measured her for a short humerus fracture brace ( sarmiento brace).  she will need a medium.  I have placed the order for the brace and will schedule another visit with the patient to fit the brace once it is delivered.  Occupational Therapy Assessment and Plan OT Assessment and Plan Clinical Impression Statement: A:  Completed fabrication of radial nerve splint with phoenix outrigger.  Assessed for size of sarmiento brace. OT Plan: P:  Fit for sarmiento brace once it arrives.   Goals Short Term Goals Time to Complete Short Term Goals: 2 weeks Short Term Goal 1: Patient will be educated on wear and care of radial nerve splint and sarmiento brace. Short Term Goal 1 Progress: Progressing toward goal Short Term Goal 2: Patient will be I with donning and doffing radial nerve splint and sarmiento brace. Short Term Goal 2 Progress: Progressing toward goal End of Session Patient Active Problem List  Diagnoses  . Fracture closed, humerus, shaft  . Radial nerve palsy   End of Session Activity Tolerance: Patient tolerated treatment well General Behavior During Session: [facility] for tasks performed Cognition: [facility] for tasks  performed   Shirlean Mylar, OTR/L  08/03/2011, 4:15 PM

## 2011-08-08 ENCOUNTER — Ambulatory Visit (HOSPITAL_COMMUNITY)
Admission: RE | Admit: 2011-08-08 | Discharge: 2011-08-08 | Disposition: A | Discharge status: Home / Self Care | Payer: Self-pay | Source: Ambulatory Visit | Attending: Internal Medicine | Admitting: Internal Medicine

## 2011-08-08 DIAGNOSIS — G5630 Lesion of radial nerve, unspecified upper limb: Secondary | ICD-10-CM

## 2011-08-08 DIAGNOSIS — S42309A Unspecified fracture of shaft of humerus, unspecified arm, initial encounter for closed fracture: Secondary | ICD-10-CM

## 2011-08-08 NOTE — Progress Notes (Signed)
Occupational Therapy Treatment  Patient Details  Name: Tammie Sweeney MRN: [id number] Date of Birth: 1965/09/17  Today's Date: 08/08/2011 Time: 1410-1430 Time Calculation (min): 20 min Splinting 20' Visit#: 3  of 4   Re-eval: 08/30/11    Subjective Symptoms/Limitations Symptoms: S:  The thumb part came loose.   Exercise/Treatments Splinting:  Refit the long finger pulley for increased extension.  The thumb outrigger had come off completely  - I reaffixed the thumb outrigger.  Short humerus fracture brace fit to patient.  I educated patient on wear and care, donning and doffing of the brace, and contraindications.  Patient was able to don and doff both the radial nerve splint and the short humerus fracture brace [name].  [phone]  Occupational Therapy Assessment and Plan OT Assessment and Plan Clinical Impression Statement: A:  Patient fit for humerus brace and fixed radial nerve splint. OT Plan: P:  Await further orders from MD>   Goals Short Term Goals Time to Complete Short Term Goals: 2 weeks Short Term Goal 1: Patient will be educated on wear and care of radial nerve splint and sarmiento brace. Short Term Goal 1 Progress: Met Short Term Goal 2: Patient will be I with donning and doffing radial nerve splint and sarmiento brace. Short Term Goal 2 Progress: Met End of Session Patient Active Problem List  Diagnoses  . Fracture closed, humerus, shaft  . Radial nerve palsy   End of Session Activity Tolerance: Patient tolerated treatment well General Behavior During Session: [facility] for tasks performed Cognition: [facility] for tasks performed   Shirlean Mylar, OTR/L  08/08/2011, 3:22 PM

## 2012-01-03 ENCOUNTER — Ambulatory Visit (HOSPITAL_COMMUNITY)
Admission: RE | Admit: 2012-01-03 | Discharge: 2012-01-03 | Disposition: A | Discharge status: Home / Self Care | Payer: Self-pay | Source: Ambulatory Visit | Attending: Orthopedic Surgery | Admitting: Orthopedic Surgery

## 2012-01-03 DIAGNOSIS — G5630 Lesion of radial nerve, unspecified upper limb: Secondary | ICD-10-CM

## 2012-01-03 DIAGNOSIS — M6281 Muscle weakness (generalized): Secondary | ICD-10-CM | POA: Insufficient documentation

## 2012-01-03 DIAGNOSIS — S42309A Unspecified fracture of shaft of humerus, unspecified arm, initial encounter for closed fracture: Secondary | ICD-10-CM

## 2012-01-03 DIAGNOSIS — M25539 Pain in unspecified wrist: Secondary | ICD-10-CM | POA: Insufficient documentation

## 2012-01-03 DIAGNOSIS — Z4689 Encounter for fitting and adjustment of other specified devices: Secondary | ICD-10-CM | POA: Insufficient documentation

## 2012-01-03 DIAGNOSIS — IMO0001 Reserved for inherently not codable concepts without codable children: Secondary | ICD-10-CM | POA: Insufficient documentation

## 2012-01-03 NOTE — Evaluation (Signed)
Occupational Therapy Evaluation  Patient Details  Name: Tammie Sweeney MRN: [id number] Date of Birth: 1965-11-26  Today's Date: 01/03/2012 Time: 1478-2956 OT Time Calculation (min): 35 min OT Eval 10' Splinting 25' Visit#: 1  of 1    Assessment Diagnosis: S/P left Humeral Shaft ORIF and Radial Nerve Palsy Surgical Date: 06/30/11 Next MD Visit: 02/25/2012     Past Medical History:  Past Medical History  Diagnosis Date  . Migraine   . Hiatal hernia    Past Surgical History:  Past Surgical History  Procedure Date  . Tonsillectomy     Subjective Symptoms/Limitations Symptoms: S:  He wants me to have a splint that supports my MCPJ. Limitations: Tammie Sweeney fell and fractured her right humeral shaft on 06/23/11. She had surgery one week later to repari the fracture.  She has associated radial nerve palsy.  In December, this therapist fabricated a radial nerve splint and a sarmiento brace for the patient.  She returns today with an order for fabrication of a splint that supports her wrist and MCPJs. Pain Assessment Currently in Pain?: No/denies   Assessment LUE AROM (degrees) LUE Overall AROM Comments: AROM is WFL except for MCPJ extension, which lacks approximately 25% full range.  Strength and coordination testing deferred as this was a one time visit for splinting.      Exercise/Treatments    Splinting Splinting: Fabricated a resting hand splint that positions her left wrist in neutral to slight extension and extends her MCPJ to allow for increased  extension while sleeping.  Patient was educated on wear and care and contraindications.    Occupational Therapy Assessment and Plan OT Assessment and Plan Clinical Impression Statement: A:  Patient presents with [facility] AROM of wrist flexion and extension.  She is able to make a full fist and is able to extend her digits at IPJs.  The MCPJ have 75% extension.   Pt will benefit from skilled therapeutic intervention in order to  improve on the following deficits: Decreased range of motion Rehab Potential: Good OT Frequency: Min 1X/week OT Duration: Other (comment) (1 week) OT Treatment/Interventions: Splinting OT Plan: P:  Await further orders from MD.   Goals Short Term Goals Time to Complete Short Term Goals: 2 weeks Short Term Goal 1: Patient will be educated on wear and care of left resting hand splint.  Problem List Patient Active Problem List  Diagnoses  . Fracture closed, humerus, shaft  . Radial nerve palsy    End of Session Activity Tolerance: Patient tolerated treatment well General Behavior During Session: [facility] for tasks performed Cognition: [facility] for tasks performed  Shirlean Mylar, OTR/L  01/03/2012, 4:42 PM  Physician Documentation Your signature is required to indicate approval of the treatment plan as stated above.  Please sign and either send electronically or make a copy of this report for your files and return this physician signed original.  Please mark one 1.__approve of plan  2. ___approve of plan with the following conditions.   ______________________________                                                          _____________________ Physician Signature  Date  

## 2014-06-28 ENCOUNTER — Other Ambulatory Visit (HOSPITAL_COMMUNITY): Payer: Self-pay | Admitting: Internal Medicine

## 2014-06-28 DIAGNOSIS — Z1231 Encounter for screening mammogram for malignant neoplasm of breast: Secondary | ICD-10-CM

## 2014-07-30 ENCOUNTER — Ambulatory Visit (HOSPITAL_COMMUNITY): Payer: Self-pay

## 2014-08-06 ENCOUNTER — Ambulatory Visit (HOSPITAL_COMMUNITY): Payer: Self-pay

## 2014-08-11 ENCOUNTER — Ambulatory Visit (HOSPITAL_COMMUNITY)
Admission: RE | Admit: 2014-08-11 | Discharge: 2014-08-11 | Disposition: A | Discharge status: Home / Self Care | Payer: BC Managed Care – PPO | Source: Ambulatory Visit | Attending: Internal Medicine | Admitting: Internal Medicine

## 2014-08-11 DIAGNOSIS — Z1231 Encounter for screening mammogram for malignant neoplasm of breast: Secondary | ICD-10-CM

## 2014-12-17 ENCOUNTER — Other Ambulatory Visit (HOSPITAL_COMMUNITY): Payer: Self-pay | Admitting: Internal Medicine

## 2014-12-17 DIAGNOSIS — R1031 Right lower quadrant pain: Secondary | ICD-10-CM

## 2014-12-20 ENCOUNTER — Ambulatory Visit (HOSPITAL_COMMUNITY)
Admission: RE | Admit: 2014-12-20 | Discharge: 2014-12-20 | Disposition: A | Discharge status: Home / Self Care | Payer: BLUE CROSS/BLUE SHIELD | Source: Ambulatory Visit | Attending: Internal Medicine | Admitting: Internal Medicine

## 2014-12-20 DIAGNOSIS — R1031 Right lower quadrant pain: Secondary | ICD-10-CM

## 2014-12-21 ENCOUNTER — Ambulatory Visit (HOSPITAL_COMMUNITY)
Admission: RE | Admit: 2014-12-21 | Discharge: 2014-12-21 | Disposition: A | Discharge status: Home / Self Care | Payer: BLUE CROSS/BLUE SHIELD | Source: Ambulatory Visit | Attending: Internal Medicine | Admitting: Internal Medicine

## 2014-12-21 ENCOUNTER — Encounter (HOSPITAL_COMMUNITY): Payer: Self-pay

## 2014-12-21 ENCOUNTER — Other Ambulatory Visit (HOSPITAL_COMMUNITY): Payer: Self-pay | Admitting: Internal Medicine

## 2014-12-21 DIAGNOSIS — R1031 Right lower quadrant pain: Secondary | ICD-10-CM | POA: Diagnosis not present

## 2014-12-21 MED ORDER — IOHEXOL 300 MG/ML  SOLN
100.0000 mL | Freq: Once | INTRAMUSCULAR | Status: AC | PRN
  Administered 2014-12-21: 100 mL via INTRAVENOUS

## 2015-01-04 ENCOUNTER — Emergency Department (HOSPITAL_COMMUNITY): Payer: BLUE CROSS/BLUE SHIELD

## 2015-01-04 ENCOUNTER — Encounter (HOSPITAL_COMMUNITY): Payer: Self-pay

## 2015-01-04 ENCOUNTER — Emergency Department (HOSPITAL_COMMUNITY)
Admission: EM | Admit: 2015-01-04 | Discharge: 2015-01-04 | Disposition: A | Discharge status: Home / Self Care | Payer: BLUE CROSS/BLUE SHIELD | Attending: Emergency Medicine | Admitting: Emergency Medicine

## 2015-01-04 DIAGNOSIS — Z8719 Personal history of other diseases of the digestive system: Secondary | ICD-10-CM | POA: Insufficient documentation

## 2015-01-04 DIAGNOSIS — Z720 Tobacco use: Secondary | ICD-10-CM | POA: Insufficient documentation

## 2015-01-04 DIAGNOSIS — Z8679 Personal history of other diseases of the circulatory system: Secondary | ICD-10-CM | POA: Diagnosis not present

## 2015-01-04 DIAGNOSIS — Z79899 Other long term (current) drug therapy: Secondary | ICD-10-CM | POA: Insufficient documentation

## 2015-01-04 DIAGNOSIS — R000 Tachycardia, unspecified: Secondary | ICD-10-CM | POA: Diagnosis not present

## 2015-01-04 DIAGNOSIS — R110 Nausea: Secondary | ICD-10-CM | POA: Insufficient documentation

## 2015-01-04 DIAGNOSIS — R630 Anorexia: Secondary | ICD-10-CM | POA: Insufficient documentation

## 2015-01-04 DIAGNOSIS — R1084 Generalized abdominal pain: Secondary | ICD-10-CM | POA: Diagnosis not present

## 2015-01-04 DIAGNOSIS — R109 Unspecified abdominal pain: Secondary | ICD-10-CM | POA: Diagnosis present

## 2015-01-04 DIAGNOSIS — R300 Dysuria: Secondary | ICD-10-CM

## 2015-01-04 LAB — URINALYSIS, ROUTINE W REFLEX MICROSCOPIC
Bilirubin Urine: NEGATIVE
GLUCOSE, UA: NEGATIVE mg/dL
Hgb urine dipstick: NEGATIVE
KETONES UR: NEGATIVE mg/dL
LEUKOCYTES UA: NEGATIVE
NITRITE: NEGATIVE
PH: 5.5 (ref 5.0–8.0)
Protein, ur: NEGATIVE mg/dL
SPECIFIC GRAVITY, URINE: 1.024 (ref 1.005–1.030)
Urobilinogen, UA: 0.2 mg/dL (ref 0.0–1.0)

## 2015-01-04 LAB — COMPREHENSIVE METABOLIC PANEL WITH GFR
ALT: 15 U/L (ref 14–54)
AST: 14 U/L — ABNORMAL LOW (ref 15–41)
Albumin: 3.8 g/dL (ref 3.5–5.0)
Alkaline Phosphatase: 52 U/L (ref 38–126)
Anion gap: 5 (ref 5–15)
BUN: 15 mg/dL (ref 6–20)
CO2: 25 mmol/L (ref 22–32)
Calcium: 9.3 mg/dL (ref 8.9–10.3)
Chloride: 108 mmol/L (ref 101–111)
Creatinine, Ser: 0.86 mg/dL (ref 0.44–1.00)
GFR calc Af Amer: >60 mL/min
GFR calc non Af Amer: >60 mL/min
Glucose, Bld: 105 mg/dL — ABNORMAL HIGH (ref 70–99)
Potassium: 3.8 mmol/L (ref 3.5–5.1)
Sodium: 138 mmol/L (ref 135–145)
Total Bilirubin: 0.5 mg/dL (ref 0.3–1.2)
Total Protein: 7 g/dL (ref 6.5–8.1)

## 2015-01-04 LAB — CBC WITH DIFFERENTIAL/PLATELET
Basophils Absolute: 0 K/uL (ref 0.0–0.1)
Basophils Relative: 0 % (ref 0–1)
Eosinophils Absolute: 0.5 K/uL (ref 0.0–0.7)
Eosinophils Relative: 5 % (ref 0–5)
HCT: 37 % (ref 36.0–46.0)
Hemoglobin: 11.7 g/dL — ABNORMAL LOW (ref 12.0–15.0)
Lymphocytes Relative: 29 % (ref 12–46)
Lymphs Abs: 2.7 K/uL (ref 0.7–4.0)
MCH: 25.3 pg — ABNORMAL LOW (ref 26.0–34.0)
MCHC: 31.6 g/dL (ref 30.0–36.0)
MCV: 80.1 fL (ref 78.0–100.0)
Monocytes Absolute: 1.1 K/uL — ABNORMAL HIGH (ref 0.1–1.0)
Monocytes Relative: 12 % (ref 3–12)
Neutro Abs: 4.9 K/uL (ref 1.7–7.7)
Neutrophils Relative %: 54 % (ref 43–77)
Platelets: 318 K/uL (ref 150–400)
RBC: 4.62 MIL/uL (ref 3.87–5.11)
RDW: 15 % (ref 11.5–15.5)
WBC: 9.2 K/uL (ref 4.0–10.5)

## 2015-01-04 LAB — TSH: TSH: 1.168 u[IU]/mL (ref 0.350–4.500)

## 2015-01-04 LAB — I-STAT CG4 LACTIC ACID, ED: LACTIC ACID, VENOUS: 1.12 mmol/L (ref 0.5–2.0)

## 2015-01-04 LAB — I-STAT TROPONIN, ED: TROPONIN I, POC: 0 ng/mL (ref 0.00–0.08)

## 2015-01-04 LAB — LIPASE, BLOOD: LIPASE: 15 U/L — AB (ref 22–51)

## 2015-01-04 MED ORDER — ONDANSETRON HCL 4 MG/2ML IJ SOLN
4.0000 mg | Freq: Once | INTRAMUSCULAR | Status: AC
  Administered 2015-01-04: 4 mg via INTRAVENOUS
  Filled 2015-01-04: qty 2

## 2015-01-04 MED ORDER — ONDANSETRON 4 MG PO TBDP
4.0000 mg | ORAL_TABLET | Freq: Three times a day (TID) | ORAL | Status: DC | PRN
Start: 2015-01-04 — End: 2016-11-26

## 2015-01-04 MED ORDER — MORPHINE SULFATE 4 MG/ML IJ SOLN
4.0000 mg | Freq: Once | INTRAMUSCULAR | Status: AC
  Administered 2015-01-04: 4 mg via INTRAVENOUS
  Filled 2015-01-04: qty 1

## 2015-01-04 MED ORDER — HYDROCODONE-ACETAMINOPHEN 5-325 MG PO TABS: 1.0000 | ORAL_TABLET | Freq: Four times a day (QID) | ORAL | Status: DC | PRN

## 2015-01-04 MED ORDER — PANTOPRAZOLE SODIUM 40 MG IV SOLR
40.0000 mg | Freq: Once | INTRAVENOUS | Status: AC
  Administered 2015-01-04: 40 mg via INTRAVENOUS
  Filled 2015-01-04: qty 40

## 2015-01-04 MED ORDER — SODIUM CHLORIDE 0.9 % IV BOLUS (SEPSIS)
1000.0000 mL | Freq: Once | INTRAVENOUS | Status: AC
  Administered 2015-01-04: 1000 mL via INTRAVENOUS

## 2015-01-04 NOTE — ED Provider Notes (Signed)
CSN: [id number]     Arrival date & time 01/04/15  1927 History   First MD Initiated Contact with Patient 01/04/15 2026     Chief Complaint  Patient presents with  . Flank Pain     (Consider location/radiation/quality/duration/timing/severity/associated sxs/prior Treatment) HPI Comments: Tammie Sweeney is a 49 y.o. female with a PMHx of migraine, hiatal hernia, and subjective hx of cholelithiasis, who presents to the ED with complaints of gradual onset right flank pain 3 days which is gradually worsening. She reports that the pain is 8/10 constant sharp pain radiating to her suprapubic and right lower quadrant area, worse with movement and urination, and mildly improved with ibuprofen 800 mg and Neurontin. She reports intermittent nausea and dysuria as additional associated symptoms. She has a family history of nephrolithiasis but no personal history of such. She states that she has had ongoing pain with eating for several months, and her PCP ordered a CT scan 2 weeks ago which showed one gallstone without cholecystitis, therefore she has been eating a low-fat diet since then, and denies any acute changes to this symptom. She admits to consuming 1 beer on Friday, 2 beers and 2 mixed drinks on Saturday, and 2 beers last night in an attempt to "flush her kidneys". She admits to chronic NSAID use for her migraines. She denies any fevers, chills, chest pain, shortness of breath, vomiting, diarrhea, constipation, melena, hematochezia, obstipation, hematuria, urinary frequency, vaginal bleeding or discharge, myalgias, arthralgias, numbness, tingling, weakness, headache, vision changes, recent travel, sick contacts, or suspicious food intake. She also reports that for the last several months she's felt hot all the time, and has had an increased appetite. Her PCP is Asencion Noble in [place]. Pt is not sexually active, on IUD.  Patient is a 49 y.o. female presenting with flank pain. The history is provided by the  patient. No language interpreter was used.  Flank Pain This is a new problem. The current episode started in the past 7 days. The problem occurs constantly. The problem has been gradually worsening. Associated symptoms include abdominal pain (RLQ/suprapubic), nausea (intermittent, currently resolved) and urinary symptoms (dysuria). Pertinent negatives include no arthralgias, chest pain, chills, diaphoresis, fever, headaches, myalgias, numbness, sore throat, vomiting or weakness. Exacerbated by: movement and urination. She has tried NSAIDs (ibuprofen and neurontin) for the symptoms. The treatment provided mild relief.    Past Medical History  Diagnosis Date  . Migraine   . Hiatal hernia    Past Surgical History  Procedure Laterality Date  . Tonsillectomy     History reviewed. No pertinent family history. History  Substance Use Topics  . Smoking status: Current Every Day Smoker  . Smokeless tobacco: Not on file  . Alcohol Use: Yes   OB History    No data available     Review of Systems  Constitutional: Positive for appetite change (increased). Negative for fever, chills and diaphoresis.  HENT: Negative for rhinorrhea and sore throat.   Eyes: Negative for visual disturbance.  Respiratory: Negative for shortness of breath.   Cardiovascular: Negative for chest pain.  Gastrointestinal: Positive for nausea (intermittent, currently resolved) and abdominal pain (RLQ/suprapubic). Negative for vomiting, diarrhea, constipation and blood in stool.  Endocrine: Positive for heat intolerance (always feels hot).  Genitourinary: Positive for dysuria and flank pain. Negative for urgency, frequency, hematuria, vaginal bleeding and vaginal discharge.  Musculoskeletal: Negative for myalgias and arthralgias.  Skin: Negative for color change.  Allergic/Immunologic: Negative for immunocompromised state.  Neurological: Negative for weakness,  light-headedness, numbness and headaches.   Psychiatric/Behavioral: Negative for confusion.   10 Systems reviewed and are negative for acute change except as noted in the HPI.    Allergies  Review of patient's allergies indicates no known allergies.  Home Medications   Prior to Admission medications   Medication Sig Start Date End Date Taking? Authorizing Provider  citalopram (CELEXA) 20 MG tablet Take 20 mg by mouth daily.      Historical Provider, MD  docusate sodium (COLACE) 100 MG capsule Take 100 mg by mouth 2 (two) times daily.      Historical Provider, MD  levocetirizine (XYZAL) 5 MG tablet Take 5 mg by mouth daily.      Historical Provider, MD  omeprazole (PRILOSEC) 20 MG capsule Take 20 mg by mouth 2 (two) times daily. Once or twice a day depending on hiatal hernia     Historical Provider, MD  vitamin C (ASCORBIC ACID) 500 MG tablet Take 1,000 mg by mouth daily.      Historical Provider, MD   BP 145/93 mmHg  Pulse 111  Temp(Src) 98.1 F (36.7 C) (Oral)  Resp 18  SpO2 100% Physical Exam  Constitutional: She is oriented to person, place, and time. She appears well-developed and well-nourished.  Non-toxic appearance. No distress.  Afebrile, nontoxic, NAD, mildly tachycardic which resolved during exam, but otherwise VSS  HENT:  Head: Normocephalic and atraumatic.  Mouth/Throat: Oropharynx is clear and moist and mucous membranes are normal.  Eyes: Conjunctivae and EOM are normal. Right eye exhibits no discharge. Left eye exhibits no discharge.  Neck: Normal range of motion. Neck supple.  Cardiovascular: Regular rhythm, normal heart sounds and intact distal pulses.  Tachycardia present.  Exam reveals no gallop and no friction rub.   No murmur heard. Mildly tachycardic which resolved during exam, reg rhythm, nl s1/s2, no m/r/g, distal pulses intact, no pedal edema  Pulmonary/Chest: Effort normal and breath sounds normal. No respiratory distress. She has no decreased breath sounds. She has no wheezes. She has no rhonchi.  She has no rales.  Abdominal: Soft. Normal appearance and bowel sounds are normal. She exhibits no distension. There is generalized tenderness. There is guarding, CVA tenderness (R sided) and tenderness at McBurney's point. There is no rigidity, no rebound and negative Murphy's sign.  Soft, obese which limits exam but no obvious distension, +BS throughout, with generalized abd TTP with most focal location in RLQ near mcburney's point, some voluntary guarding, no rebound or rigidity, neg murphy's, neg psoas sign, neg foot tap test, +R sided CVA TTP   Musculoskeletal: Normal range of motion.  MAE x4 Strength and sensation grossly intact Distal pulses intact No pedal edema  Neurological: She is alert and oriented to person, place, and time. She has normal strength. No sensory deficit.  Skin: Skin is warm, dry and intact. No rash noted.  Psychiatric: She has a normal mood and affect.  Nursing note and vitals reviewed.   ED Course  Procedures (including critical care time) Labs Review Labs Reviewed  URINALYSIS, ROUTINE W REFLEX MICROSCOPIC - Abnormal; Notable for the following:    APPearance CLOUDY (*)    All other components within normal limits  CBC WITH DIFFERENTIAL/PLATELET - Abnormal; Notable for the following:    Hemoglobin 11.7 (*)    MCH 25.3 (*)    Monocytes Absolute 1.1 (*)    All other components within normal limits  COMPREHENSIVE METABOLIC PANEL - Abnormal; Notable for the following:    Glucose, Bld 105 (*)  AST 14 (*)    All other components within normal limits  LIPASE, BLOOD - Abnormal; Notable for the following:    Lipase 15 (*)    All other components within normal limits  TSH  I-STAT TROPOININ, ED  I-STAT CG4 LACTIC ACID, ED    Imaging Review Ct Abdomen Pelvis Wo Contrast  01/04/2015   CLINICAL DATA:  RIGHT flank pain radiating to the anterior lower abdomen. No history of kidney stones. History of gallstones.  EXAM: CT ABDOMEN AND PELVIS WITHOUT CONTRAST   TECHNIQUE: Multidetector CT imaging of the abdomen and pelvis was performed following the standard protocol without IV contrast.  COMPARISON:  Most recently 12/21/2014.  FINDINGS: Musculoskeletal:  No aggressive osseous lesions.  Lung Bases: Normal.  Liver: Unenhanced CT was performed per clinician order. Lack of IV contrast limits sensitivity and specificity, especially for evaluation of abdominal/pelvic solid viscera. Normal.  Spleen:  Normal.  Gallbladder:  Contracted.  Common bile duct:  Normal.  Pancreas:  Normal.  Adrenal glands:  Normal bilaterally.  Kidneys:  Normal.  Stomach:  Normal.  Small bowel: Normal. No obstruction or mesenteric adenopathy. No inflammatory changes.  Colon:   Normal.  Normal appendix.  Pelvic Genitourinary: IUD in the uterus. Urinary bladder appears normal.  Peritoneum: No free air.  No free fluid.  Vascular/lymphatic: Atherosclerosis.  Body Wall: Periumbilical hernia containing fat.  IMPRESSION: No acute abnormality.   Electronically Signed   By: Dereck Ligas M.D.   On: 01/04/2015 22:33     EKG Interpretation None     ED ECG REPORT   Date: 01/04/2015  Rate: 98  Rhythm: normal sinus rhythm  QRS Axis: normal  Intervals: normal  ST/T Wave abnormalities: normal  Conduction Disutrbances:none  Narrative Interpretation:   Old EKG Reviewed: none available  I have personally reviewed the EKG tracing and agree with the computerized printout as noted.   MDM   Final diagnoses:  Flank pain  Abdominal pain  Nausea  Dysuria    49 y.o. female here with R flank pain radiating to suprapubic area and dysuria x3 days. Intermittent nausea. Has known gallstone from CT done 2 wks ago. On exam here she has diffuse abd tenderness, but most intense in RLQ with some voluntary guarding, concerning for appendicitis. +CVA tenderness concerning for Eriksson vs pyelo. Will get labs, give morphine and protonix and zofran and fluids, and get CT abd to eval for appendicitis vs  nephrolithiasis. Admits to EtOH use over the last 3 days, but none today, but this could be gastritis vs gastroenteritis. Given her upper abd pain on exam, will get EKG and trop. Will get lactic acid. Pt slightly tachycardic and has had increased appetite and feels hot all the time, will get TSH. No pelvic symptoms, not sexually active, doubt need for pelvic exam. Will reassess shortly.   11:10 PM Lactic acid WNL. Trop neg. EKG nonischemic. TSH WNL. CBC w/diff unremarkable aside from slight anemia which is chronic. CMP WNL. Lipase WNL. U/A clear. CT abd/pelvis WNL. Pain improved. Discussed that this could be viral, or could be IBS type picture. No acute findings that would explain her symptoms, doubt need for further work up. Will have her continue with home prilosec, and diet modifications GERD. Will give pain meds and zofran for pain, and have her f/up with PCP in 5-7 days. I explained the diagnosis and have given explicit precautions to return to the ER including for any other new or worsening symptoms. The patient understands and accepts  the medical plan as it's been dictated and I have answered their questions. Discharge instructions concerning home care and prescriptions have been given. The patient is STABLE and is discharged to home in good condition.  BP 151/85 mmHg  Pulse 97  Temp(Src) 98.1 F (36.7 C) (Oral)  Resp 18  SpO2 99%  Meds ordered this encounter  Medications  . sodium chloride 0.9 % bolus 1,000 mL    Sig:   . pantoprazole (PROTONIX) injection 40 mg    Sig:   . morphine 4 MG/ML injection 4 mg    Sig:   . ondansetron (ZOFRAN) injection 4 mg    Sig:   . ondansetron (ZOFRAN ODT) 4 MG disintegrating tablet    Sig: Take 1 tablet (4 mg total) by mouth every 8 (eight) hours as needed for nausea or vomiting.    Dispense:  15 tablet    Refill:  0    Order Specific Question:  Supervising Provider    Answer:  MILLER, BRIAN [[id number]]  . HYDROcodone-acetaminophen (NORCO) 5-325 MG per  tablet    Sig: Take 1 tablet by mouth every 6 (six) hours as needed for severe pain.    Dispense:  6 tablet    Refill:  0    Order Specific Question:  Supervising Provider    Answer:  Noemi Chapel [[id number]]     Mariajose Mow Camprubi-Soms, PA-C 01/04/15 2318  Elnora Morrison, MD 01/05/15 [phone]

## 2015-01-04 NOTE — ED Notes (Signed)
Pt complains of right flank pain that radiates to the front of the lower abdomen, hx of gallstones, no hx of kidney stones, pt also complains of painful urination and frequency, she also states that she has abdominal pain after she eats.

## 2015-01-04 NOTE — Discharge Instructions (Signed)
Use zofran as prescribed, as needed for nausea. Stay well hydrated with small sips of fluids throughout the day. Use norco as directed as needed for pain. Continue your home medications including your prilosec. Follow a BRAT (banana-rice-applesauce-toast) diet as described below for the next 24-48 hours. The 'BRAT' diet is suggested, then progress to diet as tolerated as symptoms abate. Follow up with your regular doctor in 3-5 days. Call if bloody stools, persistent diarrhea, vomiting, fever or abdominal pain. Return to ER for changing or worsening of symptoms.  Food Choices to Help Relieve Diarrhea When you have diarrhea, the foods you eat and your eating habits are very important. Choosing the right foods and drinks can help relieve diarrhea. Also, because diarrhea can last up to 7 days, you need to replace lost fluids and electrolytes (such as sodium, potassium, and chloride) in order to help prevent dehydration.  WHAT GENERAL GUIDELINES DO I NEED TO FOLLOW?  Slowly drink 1 cup (8 oz) of fluid for each episode of diarrhea. If you are getting enough fluid, your urine will be clear or pale yellow.  Eat starchy foods. Some good choices include white rice, white toast, pasta, low-fiber cereal, baked potatoes (without the skin), saltine crackers, and bagels.  Avoid large servings of any cooked vegetables.  Limit fruit to two servings per day. A serving is  cup or 1 small piece.  Choose foods with less than 2 g of fiber per serving.  Limit fats to less than 8 tsp (38 g) per day.  Avoid fried foods.  Eat foods that have probiotics in them. Probiotics can be found in certain dairy products.  Avoid foods and beverages that may increase the speed at which food moves through the stomach and intestines (gastrointestinal tract). Things to avoid include:  High-fiber foods, such as dried fruit, raw fruits and vegetables, nuts, seeds, and whole grain foods.  Spicy foods and high-fat foods.  Foods  and beverages sweetened with high-fructose corn syrup, honey, or sugar alcohols such as xylitol, sorbitol, and mannitol. WHAT FOODS ARE RECOMMENDED? Grains White rice. White, [place], or pita breads (fresh or toasted), including plain rolls, buns, or bagels. White pasta. Saltine, soda, or graham crackers. Pretzels. Low-fiber cereal. Cooked cereals made with water (such as cornmeal, farina, or cream cereals). Plain muffins. Matzo. Melba toast. Zwieback.  Vegetables Potatoes (without the skin). Strained tomato and vegetable juices. Most well-cooked and canned vegetables without seeds. Tender lettuce. Fruits Cooked or canned applesauce, apricots, cherries, fruit cocktail, grapefruit, peaches, pears, or plums. Fresh bananas, apples without skin, cherries, grapes, cantaloupe, grapefruit, peaches, oranges, or plums.  Meat and Other Protein Products Baked or boiled chicken. Eggs. Tofu. Fish. Seafood. Smooth peanut butter. Ground or well-cooked tender beef, ham, veal, lamb, pork, or poultry.  Dairy Plain yogurt, kefir, and unsweetened liquid yogurt. Lactose-free milk, buttermilk, or soy milk. Plain hard cheese. Beverages Sport drinks. Clear broths. Diluted fruit juices (except prune). Regular, caffeine-free sodas such as ginger ale. Water. Decaffeinated teas. Oral rehydration solutions. Sugar-free beverages not sweetened with sugar alcohols. Other Bouillon, broth, or soups made from recommended foods.  The items listed above may not be a complete list of recommended foods or beverages. Contact your dietitian for more options. WHAT FOODS ARE NOT RECOMMENDED? Grains Whole grain, whole wheat, bran, or rye breads, rolls, pastas, crackers, and cereals. Wild or brown rice. Cereals that contain more than 2 g of fiber per serving. Corn tortillas or taco shells. Cooked or dry oatmeal. Granola. Popcorn. Vegetables Raw vegetables.  Cabbage, broccoli, Brussels sprouts, artichokes, baked beans, beet greens, corn,  kale, legumes, peas, sweet potatoes, and yams. Potato skins. Cooked spinach and cabbage. Fruits Dried fruit, including raisins and dates. Raw fruits. Stewed or dried prunes. Fresh apples with skin, apricots, mangoes, pears, raspberries, and strawberries.  Meat and Other Protein Products Chunky peanut butter. Nuts and seeds. Beans and lentils. Berniece Salines.  Dairy High-fat cheeses. Milk, chocolate milk, and beverages made with milk, such as milk shakes. Cream. Ice cream. Sweets and Desserts Sweet rolls, doughnuts, and sweet breads. Pancakes and waffles. Fats and Oils Butter. Cream sauces. Margarine. Salad oils. Plain salad dressings. Olives. Avocados.  Beverages Caffeinated beverages (such as coffee, tea, soda, or energy drinks). Alcoholic beverages. Fruit juices with pulp. Prune juice. Soft drinks sweetened with high-fructose corn syrup or sugar alcohols. Other Coconut. Hot sauce. Chili powder. Mayonnaise. Gravy. Cream-based or milk-based soups.  The items listed above may not be a complete list of foods and beverages to avoid. Contact your dietitian for more information. WHAT SHOULD I DO IF I BECOME DEHYDRATED? Diarrhea can sometimes lead to dehydration. Signs of dehydration include dark urine and dry mouth and skin. If you think you are dehydrated, you should rehydrate with an oral rehydration solution. These solutions can be purchased at pharmacies, retail stores, or online.  Drink -1 cup (120-240 mL) of oral rehydration solution each time you have an episode of diarrhea. If drinking this amount makes your diarrhea worse, try drinking smaller amounts more often. For example, drink 1-3 tsp (5-15 mL) every 5-10 minutes.  A general rule for staying hydrated is to drink 1-2 L of fluid per day. Talk to your health care provider about the specific amount you should be drinking each day. Drink enough fluids to keep your urine clear or pale yellow. Document Released: 11/03/2003 Document Revised: 08/18/2013  Document Reviewed: 07/06/2013 [facility] Patient Information 2015 [place], [place]. This information is not intended to replace advice given to you by your health care provider. Make sure you discuss any questions you have with your health care provider.  Abdominal (belly) pain can be caused by many things. Your caregiver performed an examination and possibly ordered blood/urine tests and imaging (CT scan, x-rays, ultrasound). Many cases can be observed and treated at home after initial evaluation in the emergency department. Even though you are being discharged home, abdominal pain can be unpredictable. Therefore, you need a repeated exam if your pain does not resolve, returns, or worsens. Most patients with abdominal pain don't have to be admitted to the hospital or have surgery, but serious problems like appendicitis and gallbladder attacks can start out as nonspecific pain. Many abdominal conditions cannot be diagnosed in one visit, so follow-up evaluations are very important. SEEK IMMEDIATE MEDICAL ATTENTION IF YOU DEVELOP ANY OF THE FOLLOWING SYMPTOMS:  The pain does not go away or becomes severe.   A temperature above 101 develops.   Repeated vomiting occurs (multiple episodes).   The pain becomes localized to portions of the abdomen. The right side could possibly be appendicitis. In an adult, the left lower portion of the abdomen could be colitis or diverticulitis.   Blood is being passed in stools or vomit (bright red or black tarry stools).   Return also if you develop chest pain, difficulty breathing, dizziness or fainting, or become confused, poorly responsive, or inconsolable (young children).  The constipation stays for more than 4 days.   There is belly (abdominal) or rectal pain.   You do not seem to  be getting better.     Abdominal Pain Many things can cause belly (abdominal) pain. Most times, the belly pain is not dangerous. Many cases of belly pain can be watched and treated  at home. HOME CARE   Do not take medicines that help you go poop (laxatives) unless told to by your doctor.  Only take medicine as told by your doctor.  Eat or drink as told by your doctor. Your doctor will tell you if you should be on a special diet. GET HELP IF:  You do not know what is causing your belly pain.  You have belly pain while you are sick to your stomach (nauseous) or have runny poop (diarrhea).  You have pain while you pee or poop.  Your belly pain wakes you up at night.  You have belly pain that gets worse or better when you eat.  You have belly pain that gets worse when you eat fatty foods.  You have a fever. GET HELP RIGHT AWAY IF:   The pain does not go away within 2 hours.  You keep throwing up (vomiting).  The pain changes and is only in the right or left part of the belly.  You have bloody or tarry looking poop. MAKE SURE YOU:   Understand these instructions.  Will watch your condition.  Will get help right away if you are not doing well or get worse. Document Released: 01/30/2008 Document Revised: 08/18/2013 Document Reviewed: 04/22/2013 [facility] Patient Information 2015 [place], [place]. This information is not intended to replace advice given to you by your health care provider. Make sure you discuss any questions you have with your health care provider.  Dysuria Dysuria is the medical term for pain with urination. There are many causes for dysuria, but urinary tract infection is the most common. If a urinalysis was performed it can show that there is a urinary tract infection. A urine culture confirms that you or your child is sick. You will need to follow up with a healthcare provider because:  If a urine culture was done you will need to know the culture results and treatment recommendations.  If the urine culture was positive, you or your child will need to be put on antibiotics or know if the antibiotics prescribed are the right antibiotics  for your urinary tract infection.  If the urine culture is negative (no urinary tract infection), then other causes may need to be explored or antibiotics need to be stopped. Today laboratory work may have been done and there does not seem to be an infection. If cultures were done they will take at least 24 to 48 hours to be completed. Today x-rays may have been taken and they read as normal. No cause can be found for the problems. The x-rays may be re-read by a radiologist and you will be contacted if additional findings are made. You or your child may have been put on medications to help with this problem until you can see your primary caregiver. If the problems get better, see your primary caregiver if the problems return. If you were given antibiotics (medications which kill germs), take all of the mediations as directed for the full course of treatment.  If laboratory work was done, you need to find the results. Leave a telephone number where you can be reached. If this is not possible, make sure you find out how you are to get test results. HOME CARE INSTRUCTIONS   Drink lots of fluids. For  adults, drink eight, 8 ounce glasses of clear juice or water a day. For children, replace fluids as suggested by your caregiver.  Empty the bladder often. Avoid holding urine for long periods of time.  After a bowel movement, women should cleanse front to back, using each tissue only once.  Empty your bladder before and after sexual intercourse.  Take all the medicine given to you until it is gone. You may feel better in a few days, but TAKE ALL MEDICINE.  Avoid caffeine, tea, alcohol and carbonated beverages, because they tend to irritate the bladder.  In men, alcohol may irritate the prostate.  Only take over-the-counter or prescription medicines for pain, discomfort, or fever as directed by your caregiver.  If your caregiver has given you a follow-up appointment, it is very important to keep that  appointment. Not keeping the appointment could result in a chronic or permanent injury, pain, and disability. If there is any problem keeping the appointment, you must call back to this facility for assistance. SEEK IMMEDIATE MEDICAL CARE IF:   Back pain develops.  A fever develops.  There is nausea (feeling sick to your stomach) or vomiting (throwing up).  Problems are no better with medications or are getting worse. MAKE SURE YOU:   Understand these instructions.  Will watch your condition.  Will get help right away if you are not doing well or get worse. Document Released: 05/11/2004 Document Revised: 11/05/2011 Document Reviewed: 03/18/2008 [facility] Patient Information 2015 [place], [place]. This information is not intended to replace advice given to you by your health care provider. Make sure you discuss any questions you have with your health care provider.  Nausea, Adult Nausea is the feeling that you have an upset stomach or have to vomit. Nausea by itself is not likely a serious concern, but it may be an early sign of more serious medical problems. As nausea gets worse, it can lead to vomiting. If vomiting develops, there is the risk of dehydration.  CAUSES   Viral infections.  Food poisoning.  Medicines.  Pregnancy.  Motion sickness.  Migraine headaches.  Emotional distress.  Severe pain from any source.  Alcohol intoxication. HOME CARE INSTRUCTIONS  Get plenty of rest.  Ask your caregiver about specific rehydration instructions.  Eat small amounts of food and sip liquids more often.  Take all medicines as told by your caregiver. SEEK MEDICAL CARE IF:  You have not improved after 2 days, or you get worse.  You have a headache. SEEK IMMEDIATE MEDICAL CARE IF:   You have a fever.  You faint.  You keep vomiting or have blood in your vomit.  You are extremely weak or dehydrated.  You have dark or bloody stools.  You have severe chest or  abdominal pain. MAKE SURE YOU:  Understand these instructions.  Will watch your condition.  Will get help right away if you are not doing well or get worse. Document Released: 09/20/2004 Document Revised: 05/07/2012 Document Reviewed: 04/25/2011 [facility] Patient Information 2015 [place], [place]. This information is not intended to replace advice given to you by your health care provider. Make sure you discuss any questions you have with your health care provider.

## 2015-01-13 ENCOUNTER — Other Ambulatory Visit (HOSPITAL_COMMUNITY): Payer: Self-pay | Admitting: Internal Medicine

## 2015-01-13 DIAGNOSIS — K8020 Calculus of gallbladder without cholecystitis without obstruction: Secondary | ICD-10-CM

## 2015-01-13 DIAGNOSIS — R1011 Right upper quadrant pain: Secondary | ICD-10-CM

## 2015-01-19 ENCOUNTER — Ambulatory Visit (HOSPITAL_COMMUNITY)
Admission: RE | Admit: 2015-01-19 | Discharge: 2015-01-19 | Disposition: A | Discharge status: Home / Self Care | Payer: BLUE CROSS/BLUE SHIELD | Source: Ambulatory Visit | Attending: Internal Medicine | Admitting: Internal Medicine

## 2015-01-19 DIAGNOSIS — K8020 Calculus of gallbladder without cholecystitis without obstruction: Secondary | ICD-10-CM

## 2015-01-19 DIAGNOSIS — R1011 Right upper quadrant pain: Secondary | ICD-10-CM | POA: Diagnosis not present

## 2015-01-28 DIAGNOSIS — R102 Pelvic and perineal pain: Secondary | ICD-10-CM | POA: Insufficient documentation

## 2016-08-24 IMAGING — CT CT ABD-PELV W/ CM
2 of 5 series · 16 of 46 positions shown, 18 images · IV contrast (Omnipaque 300)
Comparison: February 14, 2006

CLINICAL DATA: Three-month history of upper abdominal pain with
more recent right lower quadrant pain

EXAM:
CT ABDOMEN AND PELVIS WITH CONTRAST
TECHNIQUE: Multidetector CT imaging of the abdomen and pelvis was performed
using the standard protocol following bolus administration of
intravenous contrast. Oral contrast was also administered.
CONTRAST:  100mL OMNIPAQUE IOHEXOL 300 MG/ML  SOLN

[Series 2: abd_pel_with 5.0 b40f · axial · 0.82mm/px · z∈[-476,-50]mm · 13 of 97 slices shown, 15 images]
[im 6/97  soft-tissue]
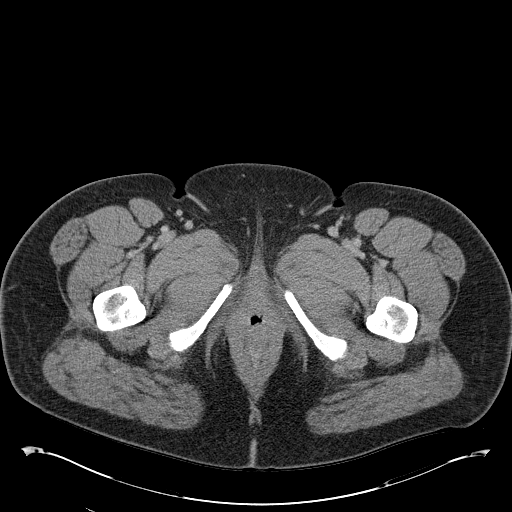
[im 6/97  bone]
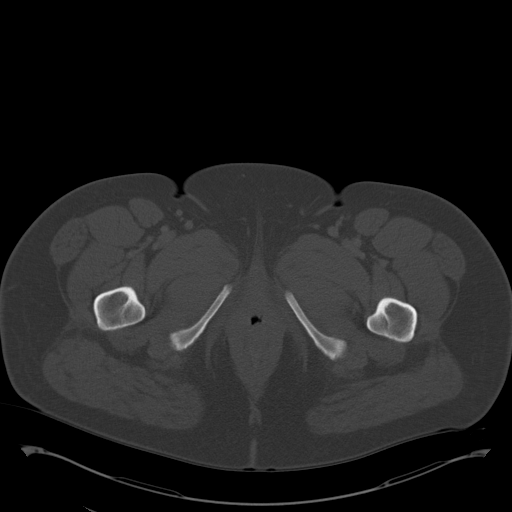
[im 12/97  soft-tissue]
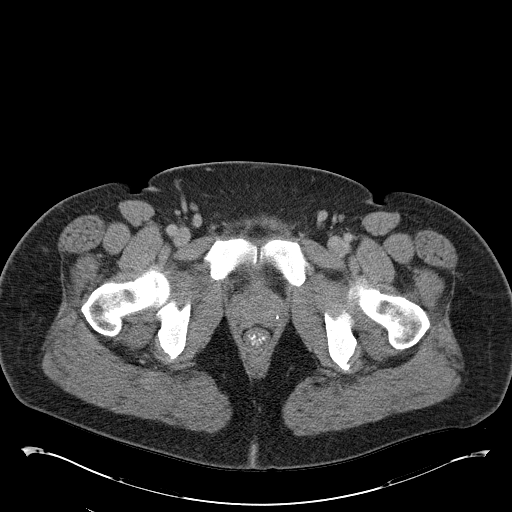
[im 23/97  soft-tissue]
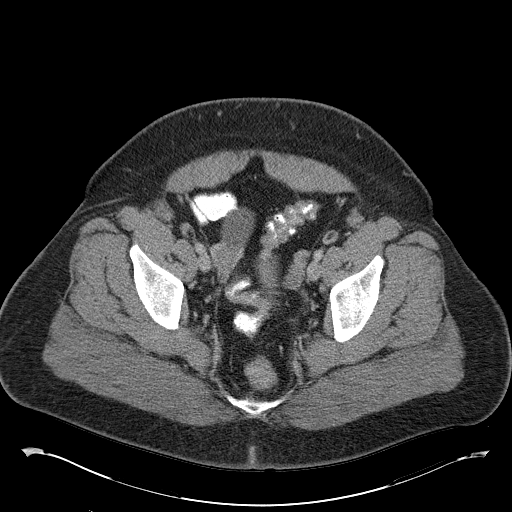
[im 29/97  soft-tissue]
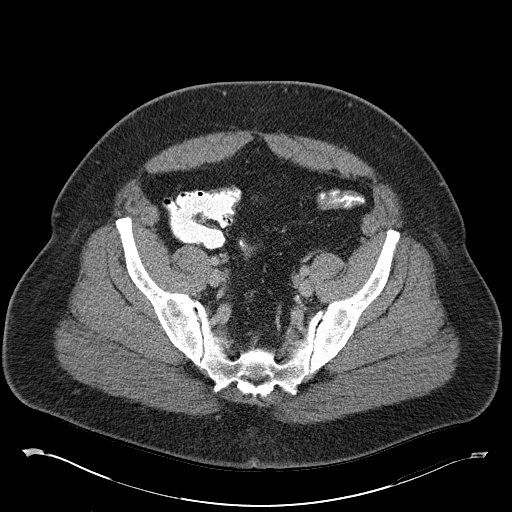
[im 34/97  soft-tissue]
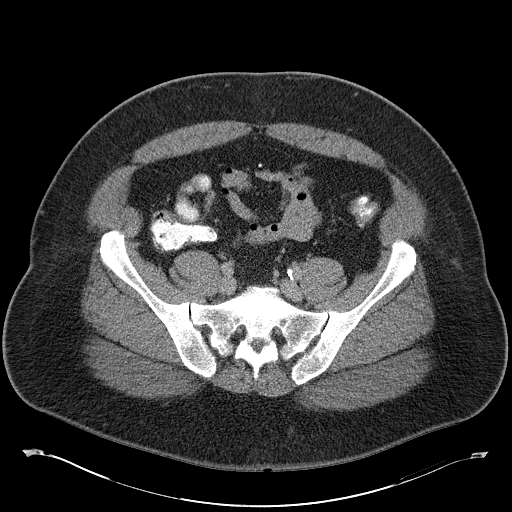
[im 40/97  soft-tissue]
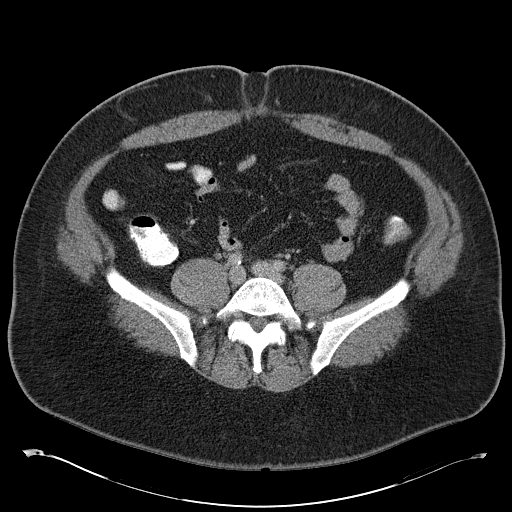
[im 51/97  soft-tissue]
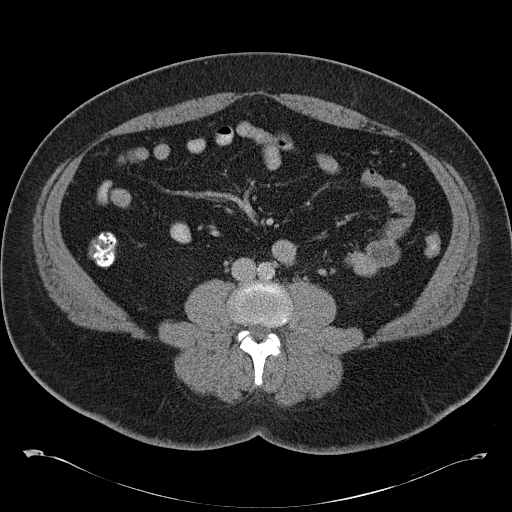
[im 57/97  soft-tissue]
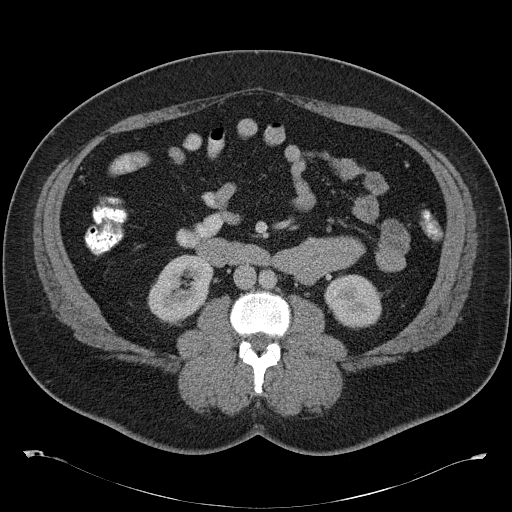
[im 63/97  soft-tissue]
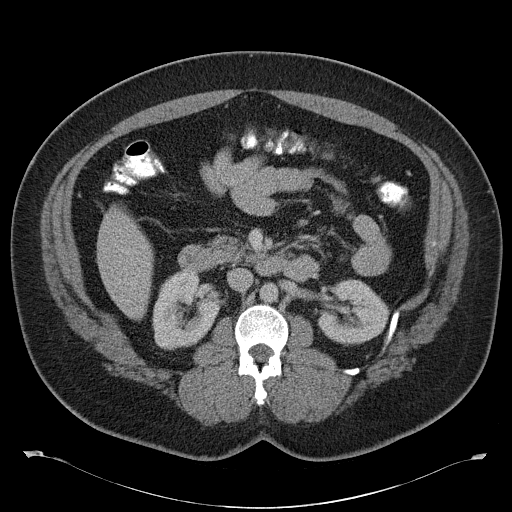
[im 63/97  bone]
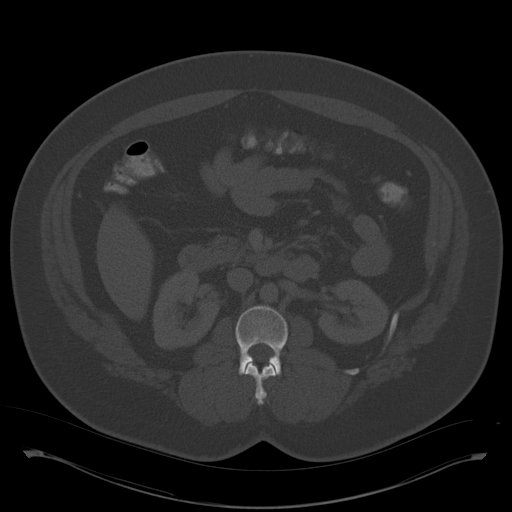
[im 68/97  soft-tissue]
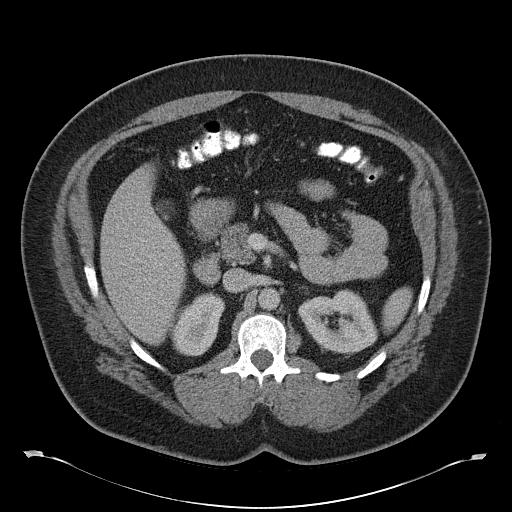
[im 74/97  soft-tissue]
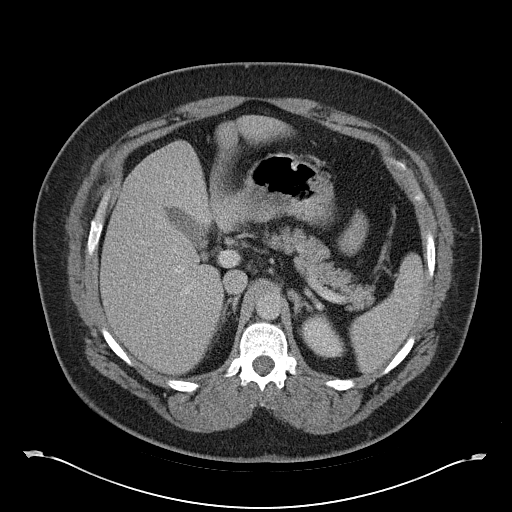
[im 85/97  soft-tissue]
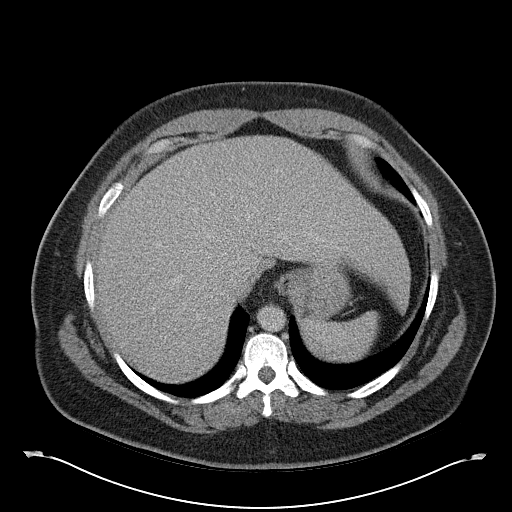
[im 91/97  soft-tissue]
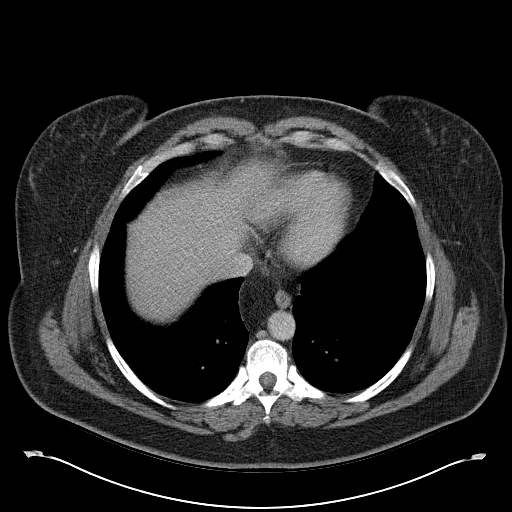

[Series 4: abd_pel_with 3.0 spo · coronal · 0.82mm/px · 3 of 106 slices shown]
[im 36/106  soft-tissue]
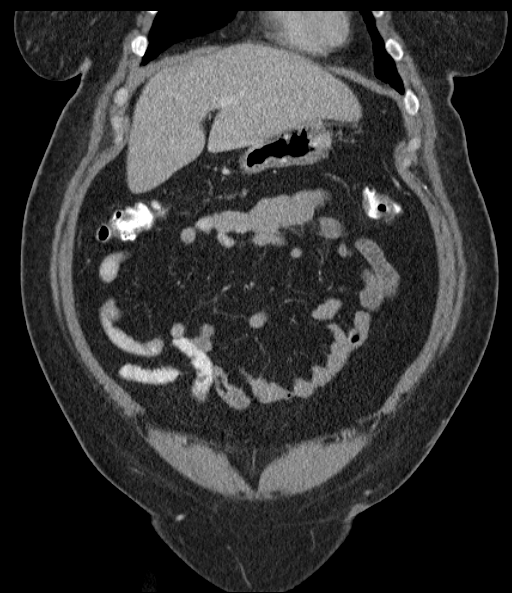
[im 47/106  soft-tissue]
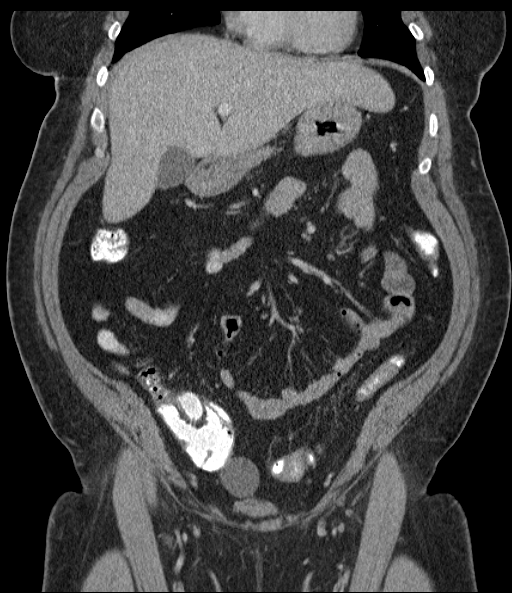
[im 59/106  soft-tissue]
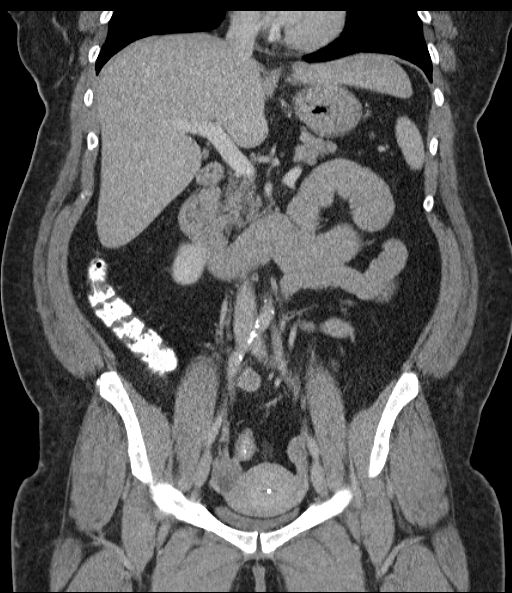

[16 of 46 positions shown; findings below may reference images not displayed]

FINDINGS: There is minimal scarring in the lateral left base. Lung bases are
otherwise clear.

No focal liver lesions are identified. There is a questionable small
gallstone in the dependent portion of the gallbladder. There is no
appreciable gallbladder wall thickening. There is no biliary duct
dilatation.

Spleen, pancreas, and adrenals appear normal. Kidneys bilaterally
show no mass or hydronephrosis on either side. There is no renal or
ureteral calculus on either side.

In the pelvis, the urinary bladder is midline with wall thickness
within normal limits. There is an intrauterine device positioned
within the endometrial region of the uterus. There is apparent
muscular hypertrophy in the sigmoid colon region. No evidence of
colitis or diverticulitis in this area. There is no pelvic mass or
pelvic fluid collection. The appendix appears normal.

There is a small ventral hernia containing only fat.

There is no bowel obstruction. No free air or portal venous air.
There is no appreciable ascites, adenopathy, or abscess in the
abdomen or pelvis. There is no abdominal aortic aneurysm. There is
atherosclerotic change in the aorta. There are no blastic or lytic
bone lesions.
IMPRESSION: Intrauterine device within the uterus. Probable muscular hypertrophy
in the sigmoid colon without evidence of inflammatory change.

Appendix appears normal. No bowel obstruction. No abscess. Small
ventral hernia containing only fat.

Suspect small gallstone in the dependent portion of the gallbladder.
There is no gallbladder wall thickening.

## 2016-11-14 ENCOUNTER — Encounter (INDEPENDENT_AMBULATORY_CARE_PROVIDER_SITE_OTHER): Payer: Self-pay | Admitting: Internal Medicine

## 2016-11-14 ENCOUNTER — Encounter (INDEPENDENT_AMBULATORY_CARE_PROVIDER_SITE_OTHER): Payer: Self-pay

## 2016-11-26 ENCOUNTER — Ambulatory Visit (INDEPENDENT_AMBULATORY_CARE_PROVIDER_SITE_OTHER): Payer: Self-pay | Admitting: Internal Medicine

## 2016-11-26 ENCOUNTER — Encounter (INDEPENDENT_AMBULATORY_CARE_PROVIDER_SITE_OTHER): Payer: Self-pay | Admitting: Internal Medicine

## 2016-11-26 ENCOUNTER — Encounter (INDEPENDENT_AMBULATORY_CARE_PROVIDER_SITE_OTHER): Payer: Self-pay | Admitting: *Deleted

## 2016-11-26 VITALS — BP 134/84 | HR 80 | Temp 98.2°F | Ht 64.0 in | Wt 222.9 lb

## 2016-11-26 DIAGNOSIS — R1319 Other dysphagia: Secondary | ICD-10-CM

## 2016-11-26 DIAGNOSIS — R1310 Dysphagia, unspecified: Secondary | ICD-10-CM

## 2016-11-26 NOTE — Patient Instructions (Addendum)
DG esophagram.   

## 2016-11-26 NOTE — Progress Notes (Signed)
   Subjective:    Patient ID: Tammie Sweeney, female    DOB: 08/24/1966, 51 y.o.   MRN: [id number]  HPI Referred by Dr. Willey Blade for dysphagia.  She tells me foods are lodging. She says she has stopped eating bread. She says even Carnation breakfast liquid which is slow to go down. She has trouble with soft foods. She has no problem eating meats.  She also says she has a dull epigastric pain off and on for year.  Her appetite is good. No weight loss. She does have some nausea.  She has a BM x 2  She saw DR. Marlou Starks last week at [facility] for umbilical hernia.    Review of Systems Past Medical History:  Diagnosis Date  . Hiatal hernia   . Migraine     Past Surgical History:  Procedure Laterality Date  . TONSILLECTOMY      No Known Allergies  Current Outpatient Prescriptions on File Prior to Visit  Medication Sig Dispense Refill  . buPROPion (WELLBUTRIN XL) 150 MG 24 hr tablet Take 450 mg by mouth daily.   4  . citalopram (CELEXA) 20 MG tablet Take 20 mg by mouth daily.      Marland Kitchen esomeprazole (NEXIUM) 20 MG capsule Take 20 mg by mouth daily at 12 noon.    Marland Kitchen levocetirizine (XYZAL) 5 MG tablet Take 5 mg by mouth daily.      Marland Kitchen doxycycline (VIBRA-TABS) 100 MG tablet Take 100 mg by mouth 2 (two) times daily.   3   No current facility-administered medications on file prior to visit.        Objective:   Physical Exam Blood pressure 134/84, pulse 80, temperature 98.2 F (36.8 C), height 5\' 4"  (1.626 m), weight 222 lb 14.4 oz (101.1 kg).  Alert and oriented. Skin warm and dry. Oral mucosa is moist.   . Sclera anicteric, conjunctivae is pink. Thyroid not enlarged. No cervical lymphadenopathy. Lungs clear. Heart regular rate and rhythm.  Abdomen is soft. Bowel sounds are positive. No hepatomegaly. No abdominal masses felt. Slight epigastric tenderness. Slight umbilical tenderness. No edema to lower extremities.         Assessment & Plan:  Dysphagia: DG esphagram. Further  recommendations to follow.

## 2016-11-28 ENCOUNTER — Ambulatory Visit (HOSPITAL_COMMUNITY)
Admission: RE | Admit: 2016-11-28 | Discharge: 2016-11-28 | Disposition: A | Discharge status: Home / Self Care | Payer: Self-pay | Source: Ambulatory Visit | Attending: Internal Medicine | Admitting: Internal Medicine

## 2016-11-28 DIAGNOSIS — R1319 Other dysphagia: Secondary | ICD-10-CM

## 2016-11-28 DIAGNOSIS — R1310 Dysphagia, unspecified: Secondary | ICD-10-CM | POA: Insufficient documentation

## 2016-11-28 DIAGNOSIS — K449 Diaphragmatic hernia without obstruction or gangrene: Secondary | ICD-10-CM | POA: Insufficient documentation

## 2016-12-10 ENCOUNTER — Encounter (INDEPENDENT_AMBULATORY_CARE_PROVIDER_SITE_OTHER): Payer: Self-pay | Admitting: Internal Medicine

## 2016-12-10 NOTE — Progress Notes (Signed)
Patient was given an appointment for 03/11/17 at 9:00am.  A letter was mailed to the patient.

## 2017-03-11 ENCOUNTER — Encounter (INDEPENDENT_AMBULATORY_CARE_PROVIDER_SITE_OTHER): Payer: Self-pay | Admitting: Internal Medicine

## 2017-03-11 ENCOUNTER — Ambulatory Visit (INDEPENDENT_AMBULATORY_CARE_PROVIDER_SITE_OTHER): Payer: Self-pay | Admitting: Internal Medicine

## 2017-09-10 ENCOUNTER — Emergency Department (HOSPITAL_COMMUNITY)
Admission: EM | Admit: 2017-09-10 | Discharge: 2017-09-10 | Disposition: A | Discharge status: Home / Self Care | Payer: Self-pay | Attending: Emergency Medicine | Admitting: Emergency Medicine

## 2017-09-10 ENCOUNTER — Encounter (HOSPITAL_COMMUNITY): Payer: Self-pay | Admitting: *Deleted

## 2017-09-10 ENCOUNTER — Other Ambulatory Visit: Payer: Self-pay

## 2017-09-10 DIAGNOSIS — R440 Auditory hallucinations: Secondary | ICD-10-CM | POA: Insufficient documentation

## 2017-09-10 DIAGNOSIS — Z5321 Procedure and treatment not carried out due to patient leaving prior to being seen by health care provider: Secondary | ICD-10-CM | POA: Insufficient documentation

## 2017-09-10 NOTE — ED Notes (Signed)
Pt informed registration she was leaving  

## 2017-09-10 NOTE — ED Triage Notes (Signed)
Pt c/o hearing conversations of people that are not there; pt states the people are talking about "getting stuff together"

## 2018-01-15 ENCOUNTER — Other Ambulatory Visit (HOSPITAL_COMMUNITY): Payer: Self-pay | Admitting: Internal Medicine

## 2018-01-15 DIAGNOSIS — Z1231 Encounter for screening mammogram for malignant neoplasm of breast: Secondary | ICD-10-CM

## 2018-01-30 ENCOUNTER — Ambulatory Visit (HOSPITAL_COMMUNITY): Payer: Self-pay

## 2019-04-21 DIAGNOSIS — J310 Chronic rhinitis: Secondary | ICD-10-CM | POA: Insufficient documentation

## 2019-04-21 DIAGNOSIS — H9193 Unspecified hearing loss, bilateral: Secondary | ICD-10-CM | POA: Insufficient documentation

## 2019-04-21 DIAGNOSIS — H6993 Unspecified Eustachian tube disorder, bilateral: Secondary | ICD-10-CM | POA: Insufficient documentation

## 2019-12-21 ENCOUNTER — Ambulatory Visit: Payer: Self-pay | Attending: Internal Medicine

## 2019-12-21 ENCOUNTER — Other Ambulatory Visit: Payer: Self-pay

## 2019-12-21 DIAGNOSIS — Z20822 Contact with and (suspected) exposure to covid-19: Secondary | ICD-10-CM | POA: Insufficient documentation

## 2019-12-22 LAB — SARS-COV-2, NAA 2 DAY TAT

## 2019-12-22 LAB — NOVEL CORONAVIRUS, NAA: SARS-CoV-2, NAA: NOT DETECTED

## 2020-01-04 ENCOUNTER — Ambulatory Visit: Payer: Self-pay | Attending: Internal Medicine

## 2020-01-04 DIAGNOSIS — Z20822 Contact with and (suspected) exposure to covid-19: Secondary | ICD-10-CM

## 2020-01-05 LAB — NOVEL CORONAVIRUS, NAA: SARS-CoV-2, NAA: NOT DETECTED

## 2020-01-05 LAB — SARS-COV-2, NAA 2 DAY TAT

## 2020-02-11 ENCOUNTER — Other Ambulatory Visit (HOSPITAL_COMMUNITY): Payer: Self-pay | Admitting: Internal Medicine

## 2020-02-11 DIAGNOSIS — Z1231 Encounter for screening mammogram for malignant neoplasm of breast: Secondary | ICD-10-CM

## 2020-03-03 ENCOUNTER — Ambulatory Visit (HOSPITAL_COMMUNITY): Payer: Self-pay

## 2021-04-19 ENCOUNTER — Ambulatory Visit: Payer: Self-pay | Admitting: Nurse Practitioner

## 2021-04-24 ENCOUNTER — Ambulatory Visit (INDEPENDENT_AMBULATORY_CARE_PROVIDER_SITE_OTHER): Payer: Self-pay | Admitting: Nurse Practitioner

## 2021-04-24 ENCOUNTER — Other Ambulatory Visit: Payer: Self-pay

## 2021-04-24 ENCOUNTER — Encounter: Payer: Self-pay | Admitting: Nurse Practitioner

## 2021-04-24 VITALS — BP 122/61 | HR 114 | Ht 63.5 in | Wt 233.0 lb

## 2021-04-24 DIAGNOSIS — K219 Gastro-esophageal reflux disease without esophagitis: Secondary | ICD-10-CM | POA: Insufficient documentation

## 2021-04-24 DIAGNOSIS — M25562 Pain in left knee: Secondary | ICD-10-CM | POA: Insufficient documentation

## 2021-04-24 DIAGNOSIS — Z1231 Encounter for screening mammogram for malignant neoplasm of breast: Secondary | ICD-10-CM | POA: Insufficient documentation

## 2021-04-24 DIAGNOSIS — G4730 Sleep apnea, unspecified: Secondary | ICD-10-CM | POA: Insufficient documentation

## 2021-04-24 DIAGNOSIS — Z7689 Persons encountering health services in other specified circumstances: Secondary | ICD-10-CM | POA: Insufficient documentation

## 2021-04-24 DIAGNOSIS — M25561 Pain in right knee: Secondary | ICD-10-CM | POA: Insufficient documentation

## 2021-04-24 DIAGNOSIS — Z30432 Encounter for removal of intrauterine contraceptive device: Secondary | ICD-10-CM

## 2021-04-24 DIAGNOSIS — G8929 Other chronic pain: Secondary | ICD-10-CM

## 2021-04-24 DIAGNOSIS — Z0001 Encounter for general adult medical examination with abnormal findings: Secondary | ICD-10-CM

## 2021-04-24 DIAGNOSIS — R569 Unspecified convulsions: Secondary | ICD-10-CM | POA: Insufficient documentation

## 2021-04-24 DIAGNOSIS — Z1211 Encounter for screening for malignant neoplasm of colon: Secondary | ICD-10-CM | POA: Insufficient documentation

## 2021-04-24 MED ORDER — FAMOTIDINE 20 MG PO TABS: 20.0000 mg | ORAL_TABLET | Freq: Two times a day (BID) | ORAL | 1 refills | Status: DC

## 2021-04-24 NOTE — Assessment & Plan Note (Addendum)
-  discussed need for weight loss; encouraged non-weight-bearing aerobic exercise like water aerobics -MO is likely contributory to her GERD and sleep apnea as well as HTN

## 2021-04-24 NOTE — Assessment & Plan Note (Signed)
-  obtain records 

## 2021-04-24 NOTE — Progress Notes (Signed)
New Patient Office Visit  Subjective:  Patient ID: Tammie Sweeney, female    DOB: 04/09/1966  Age: 55 y.o. MRN: [id number]  CC:  Chief Complaint  Patient presents with   Annual Exam    CPE    HPI [name] presents for new patient visit. Transferring care from Dr. Willey Blade. Last physical was 2-3 years ago (before COVID). Last labs were drawn in the Fall of 2021.  She is concerned with reflux.  She states that she coughs and feels like she has something stuck in her throat. She states she broke her left arm because she coughs ed so hard that she passed out and fx her arm. She had GI referral, and she states she had EGD and that was negative prior to [facility].  She states that her symptoms are unchanged.  She feels like her knees are weak when she goes up stairs. She feels like she has some joint instability and it is worse coming down stairs.  She states that she has muscle spasms for several seconds. She feels like an electrial current is running through her body. She feels like she is shaking. She states that she more she coughs, the more she has these "spasms". She states she has lost consciousness before.  She has sleep apnea, but doesn't wear a CPAP or BiPAP. She states she never had sleep study, and can't afford one.  She has IUD that needs to come out.  Past Medical History:  Diagnosis Date   Anxiety    Depression    GERD (gastroesophageal reflux disease)    Hiatal hernia    Hyperlipidemia    Migraine     Past Surgical History:  Procedure Laterality Date   INCONTINENCE SURGERY     left arm fracture repair     TONSILLECTOMY      History reviewed. No pertinent family history.  Social History   Socioeconomic History   Marital status: Single    Spouse name: Not on file   Number of children: 0   Years of education: Not on file   Highest education level: Not on file  Occupational History   Occupation: DUI educator   Occupation: counselor    Comment: with  resolution counseling; worked at [employer] previously  Tobacco Use   Smoking status: Every Day    Packs/day: 0.50    Years: 40.00    Pack years: 20.00    Types: Cigarettes   Smokeless tobacco: Never   Tobacco comments:    1/2 pack a day since a teenager.   Vaping Use   Vaping Use: Never used  Substance and Sexual Activity   Alcohol use: Yes    Comment: maybe twice per year   Drug use: No   Sexual activity: Yes    Birth control/protection: I.U.D.  Other Topics Concern   Not on file  Social History Narrative   Not on file   Social Determinants of Health   Financial Resource Strain: Not on file  Food Insecurity: Not on file  Transportation Needs: Not on file  Physical Activity: Not on file  Stress: Not on file  Social Connections: Not on file  Intimate Partner Violence: Not on file    ROS Review of Systems  Constitutional: Negative.   Respiratory:  Positive for cough.   Cardiovascular: Negative.   Gastrointestinal:        Reflux  Musculoskeletal:  Positive for arthralgias.       Bilateral knee pain  Neurological:  Positive for seizures.       "Whole body spasms" with unconsciousness- haven't happened recently   Objective:   Today's Vitals: BP 122/61 (BP Location: Left Arm, Patient Position: Sitting, Cuff Size: Large)   Pulse (!) 114   Ht 5' 3.5" (1.613 m)   Wt 233 lb (105.7 kg)   SpO2 96%   BMI 40.63 kg/m   Physical Exam Constitutional:      Appearance: Normal appearance.  Cardiovascular:     Rate and Rhythm: Normal rate and regular rhythm.     Pulses: Normal pulses.     Heart sounds: Normal heart sounds.  Pulmonary:     Effort: Pulmonary effort is normal.     Breath sounds: Normal breath sounds.  Neurological:     General: No focal deficit present.     Mental Status: She is alert and oriented to person, place, and time.    Assessment & Plan:   Problem List Items Addressed This Visit       Respiratory   Sleep apnea    -states she has been told  by others that she stops breathing during her sleep -referral to pulm for sleep study -may be contributory to other issues she has      Relevant Orders   Ambulatory referral to Pulmonology     Digestive   GERD (gastroesophageal reflux disease)    -refilled omeprazole -Rx. pepcid -she states she has cough, and had w/u; result of w/u was that she has GERD; hasn't improved since prior to COVID      Relevant Medications   omeprazole (PRILOSEC) 40 MG capsule   famotidine (PEPCID) 20 MG tablet   Other Relevant Orders   Ambulatory referral to Gastroenterology     Other   Establishing care with new doctor, encounter for - Primary    -obtain records      Seizure-like activity [facility])    -states that she had whole body "spasms" after coughing fits that resulted in her being unconscious and she fractured her left arm previously (was surgically repaired) -recommended neurology for seizure eval; she is refusing today d/t cost and being self-pay as well as "this hasn't happened in a while"      Screening mammogram for breast cancer    -mammo ordered      Relevant Orders   MM Digital Screening   Morbid obesity ([place])    -discussed need for weight loss; encouraged non-weight-bearing aerobic exercise like water aerobics -MO is likely contributory to her GERD and sleep apnea as well as HTN      Other Visit Diagnoses     Colon cancer screening       Relevant Orders   Ambulatory referral to Gastroenterology   Awaiting removal of contraceptive intrauterine device (IUD)       Relevant Orders   Ambulatory referral to Gynecology   Encounter for general adult medical examination with abnormal findings       Relevant Orders   CBC with Differential/Platelet   CMP14+EGFR   Lipid Panel With LDL/HDL Ratio       Outpatient Encounter Medications as of 04/24/2021  Medication Sig   buPROPion HCl ER, XL, 450 MG TB24 Take 450 mg by mouth daily.    citalopram (CELEXA) 40 MG tablet Take 40 mg by  mouth daily.     docusate sodium (COLACE) 50 MG capsule Take by mouth 2 (two) times daily.   famotidine (PEPCID) 20 MG tablet Take 1 tablet (20 mg  total) by mouth 2 (two) times daily.   ibuprofen (ADVIL) 800 MG tablet Take 800 mg by mouth 3 (three) times daily as needed.   levocetirizine (XYZAL) 5 MG tablet Take 5 mg by mouth daily.     lisinopril (ZESTRIL) 20 MG tablet Take 20 mg by mouth daily.   methocarbamol (ROBAXIN) 500 MG tablet Take 500 mg by mouth 2 (two) times daily as needed.   Multiple Vitamin (MULTI-VITAMIN DAILY PO) Take 1 tablet by mouth daily.   omeprazole (PRILOSEC) 40 MG capsule Take 40 mg by mouth 2 (two) times daily.   [DISCONTINUED] doxycycline (VIBRA-TABS) 100 MG tablet Take 100 mg by mouth 2 (two) times daily.    [DISCONTINUED] esomeprazole (NEXIUM) 20 MG capsule Take 20 mg by mouth daily at 12 noon.   [DISCONTINUED] Flax Oil-Fish Oil-Borage Oil (FISH-FLAX-BORAGE) CAPS Take by mouth.   [DISCONTINUED] hydrOXYzine (ATARAX/VISTARIL) 25 MG tablet Take 25 mg by mouth as needed.   [DISCONTINUED] sulfamethoxazole-trimethoprim (BACTRIM,SEPTRA) 400-80 MG tablet Take 1 tablet by mouth 2 (two) times daily. (Patient not taking: Reported on 04/24/2021)   No facility-administered encounter medications on file as of 04/24/2021.    Follow-up: Return in about 1 month (around 05/25/2021) for Physical exam.   Noreene Larsson, NP

## 2021-04-24 NOTE — Assessment & Plan Note (Signed)
-  refilled omeprazole -Rx. pepcid -she states she has cough, and had w/u; result of w/u was that she has GERD; hasn't improved since prior to COVID

## 2021-04-24 NOTE — Patient Instructions (Signed)
Please have fasting labs drawn 2-3 days prior to your appointment so we can discuss the results during your office visit.  

## 2021-04-24 NOTE — Assessment & Plan Note (Signed)
-  she states that she has instability when coming down stairs -offered ortho referral, but she declined d/t affordability -may work this up in the future when she is ready

## 2021-04-24 NOTE — Assessment & Plan Note (Signed)
-  states she has been told by others that she stops breathing during her sleep -referral to pulm for sleep study -may be contributory to other issues she has

## 2021-04-24 NOTE — Assessment & Plan Note (Signed)
mammo ordered.

## 2021-04-24 NOTE — Assessment & Plan Note (Signed)
-  states that she had whole body "spasms" after coughing fits that resulted in her being unconscious and she fractured her left arm previously (was surgically repaired) -recommended neurology for seizure eval; she is refusing today d/t cost and being self-pay as well as "this hasn't happened in a while"

## 2021-04-25 ENCOUNTER — Encounter (INDEPENDENT_AMBULATORY_CARE_PROVIDER_SITE_OTHER): Payer: Self-pay | Admitting: *Deleted

## 2021-05-03 ENCOUNTER — Telehealth: Payer: Self-pay

## 2021-05-03 ENCOUNTER — Other Ambulatory Visit: Payer: Self-pay

## 2021-05-03 MED ORDER — LISINOPRIL 20 MG PO TABS: 20.0000 mg | ORAL_TABLET | Freq: Every day | ORAL | 0 refills | Status: DC

## 2021-05-03 MED ORDER — BUPROPION HCL ER (XL) 450 MG PO TB24: 450.0000 mg | ORAL_TABLET | Freq: Every day | ORAL | 4 refills | Status: DC

## 2021-05-03 MED ORDER — LEVOCETIRIZINE DIHYDROCHLORIDE 5 MG PO TABS: 5.0000 mg | ORAL_TABLET | Freq: Every day | ORAL | 0 refills | Status: DC

## 2021-05-03 MED ORDER — CITALOPRAM HYDROBROMIDE 40 MG PO TABS: 40.0000 mg | ORAL_TABLET | Freq: Every day | ORAL | 0 refills | Status: DC

## 2021-05-03 MED ORDER — OMEPRAZOLE 40 MG PO CPDR: 40.0000 mg | DELAYED_RELEASE_CAPSULE | Freq: Two times a day (BID) | ORAL | 0 refills | Status: DC

## 2021-05-03 NOTE — Telephone Encounter (Signed)
Kevil called had questions on prescriptions that was sent into the pharmacy call back # [phone]

## 2021-05-03 NOTE — Telephone Encounter (Signed)
Patient called need med refills citalopram (CELEXA) 40 MG tablet  levocetirizine (XYZAL) 5 MG tablet    IuPROPion HCl ER, XL, 450 MG TB24   lisinopril (ZESTRIL) 20 MG tablet   omeprazole (PRILOSEC) 40 MG capsule   Sulfanethox/trimmetho 800 mg    Pharmacy:  [facility]

## 2021-05-03 NOTE — Telephone Encounter (Signed)
Rx sent 

## 2021-05-04 ENCOUNTER — Other Ambulatory Visit: Payer: Self-pay | Admitting: Nurse Practitioner

## 2021-05-04 NOTE — Telephone Encounter (Signed)
We need a visit for this. She wasn't taking this when she was seen for her new patient visit, and I don't see any records from Dr. Willey Blade (so I don't have an indication).

## 2021-05-04 NOTE — Telephone Encounter (Signed)
Left message

## 2021-05-04 NOTE — Telephone Encounter (Signed)
Spoke with pharmacist. It is cheaper for pt to get Bupropion '150mg'$  tabs that she'll take three of rather than the '450mg'$  tabs once daily. This okay to change for insurance purposes? Pt is okay with this also.

## 2021-05-04 NOTE — Telephone Encounter (Signed)
Pharmacist informed. 

## 2021-05-04 NOTE — Telephone Encounter (Signed)
sure

## 2021-05-20 LAB — CMP14+EGFR
ALT: 25 IU/L (ref 0–32)
AST: 18 IU/L (ref 0–40)
Albumin/Globulin Ratio: 2 (ref 1.2–2.2)
Albumin: 4.7 g/dL (ref 3.8–4.9)
Alkaline Phosphatase: 67 IU/L (ref 44–121)
BUN/Creatinine Ratio: 14 (ref 9–23)
BUN: 14 mg/dL (ref 6–24)
Bilirubin Total: 0.2 mg/dL (ref 0.0–1.2)
CO2: 23 mmol/L (ref 20–29)
Calcium: 10 mg/dL (ref 8.7–10.2)
Chloride: 102 mmol/L (ref 96–106)
Creatinine, Ser: 1.01 mg/dL — ABNORMAL HIGH (ref 0.57–1.00)
Globulin, Total: 2.3 g/dL (ref 1.5–4.5)
Glucose: 126 mg/dL — ABNORMAL HIGH (ref 65–99)
Potassium: 4.7 mmol/L (ref 3.5–5.2)
Sodium: 141 mmol/L (ref 134–144)
Total Protein: 7 g/dL (ref 6.0–8.5)
eGFR: 67 mL/min/{1.73_m2} (ref >59)

## 2021-05-20 LAB — CBC WITH DIFFERENTIAL/PLATELET
Basophils Absolute: 0.1 10*3/uL (ref 0.0–0.2)
Basos: 1 %
EOS (ABSOLUTE): 0.3 10*3/uL (ref 0.0–0.4)
Eos: 3 %
Hematocrit: 40.6 % (ref 34.0–46.6)
Hemoglobin: 13.6 g/dL (ref 11.1–15.9)
Immature Grans (Abs): 0 10*3/uL (ref 0.0–0.1)
Immature Granulocytes: 1 %
Lymphocytes Absolute: 2.1 10*3/uL (ref 0.7–3.1)
Lymphs: 27 %
MCH: 26.1 pg — ABNORMAL LOW (ref 26.6–33.0)
MCHC: 33.5 g/dL (ref 31.5–35.7)
MCV: 78 fL — ABNORMAL LOW (ref 79–97)
Monocytes Absolute: 0.7 10*3/uL (ref 0.1–0.9)
Monocytes: 9 %
Neutrophils Absolute: 4.4 10*3/uL (ref 1.4–7.0)
Neutrophils: 59 %
Platelets: 323 10*3/uL (ref 150–450)
RBC: 5.22 x10E6/uL (ref 3.77–5.28)
RDW: 13.4 % (ref 11.7–15.4)
WBC: 7.6 10*3/uL (ref 3.4–10.8)

## 2021-05-20 LAB — LIPID PANEL WITH LDL/HDL RATIO
Cholesterol, Total: 259 mg/dL — ABNORMAL HIGH (ref 100–199)
HDL: 39 mg/dL — ABNORMAL LOW (ref >39)
LDL Chol Calc (NIH): 161 mg/dL — ABNORMAL HIGH (ref 0–99)
LDL/HDL Ratio: 4.1 ratio — ABNORMAL HIGH (ref 0.0–3.2)
Triglycerides: 310 mg/dL — ABNORMAL HIGH (ref 0–149)
VLDL Cholesterol Cal: 59 mg/dL — ABNORMAL HIGH (ref 5–40)

## 2021-05-22 NOTE — Progress Notes (Signed)
LDL< or bad cholesterol, is significantly elevated. We will discuss this at her appointment on 9/28.

## 2021-05-24 ENCOUNTER — Ambulatory Visit: Payer: Self-pay | Admitting: Physician Assistant

## 2021-05-24 ENCOUNTER — Ambulatory Visit (INDEPENDENT_AMBULATORY_CARE_PROVIDER_SITE_OTHER): Payer: Self-pay | Admitting: Nurse Practitioner

## 2021-05-24 ENCOUNTER — Encounter: Payer: Self-pay | Admitting: Nurse Practitioner

## 2021-05-24 ENCOUNTER — Other Ambulatory Visit: Payer: Self-pay

## 2021-05-24 VITALS — BP 155/95 | HR 115 | Temp 98.6°F | Ht 64.0 in | Wt 236.1 lb

## 2021-05-24 DIAGNOSIS — G43909 Migraine, unspecified, not intractable, without status migrainosus: Secondary | ICD-10-CM | POA: Insufficient documentation

## 2021-05-24 DIAGNOSIS — E782 Mixed hyperlipidemia: Secondary | ICD-10-CM | POA: Insufficient documentation

## 2021-05-24 DIAGNOSIS — L700 Acne vulgaris: Secondary | ICD-10-CM | POA: Insufficient documentation

## 2021-05-24 DIAGNOSIS — G43009 Migraine without aura, not intractable, without status migrainosus: Secondary | ICD-10-CM

## 2021-05-24 DIAGNOSIS — G5630 Lesion of radial nerve, unspecified upper limb: Secondary | ICD-10-CM

## 2021-05-24 DIAGNOSIS — Z23 Encounter for immunization: Secondary | ICD-10-CM

## 2021-05-24 DIAGNOSIS — R7301 Impaired fasting glucose: Secondary | ICD-10-CM | POA: Insufficient documentation

## 2021-05-24 MED ORDER — IBUPROFEN 800 MG PO TABS: 800.0000 mg | ORAL_TABLET | Freq: Three times a day (TID) | ORAL | 2 refills | Status: DC | PRN

## 2021-05-24 MED ORDER — SULFAMETHOXAZOLE-TRIMETHOPRIM 800-160 MG PO TABS: 1.0000 | ORAL_TABLET | Freq: Every day | ORAL | 3 refills | Status: DC

## 2021-05-24 MED ORDER — ROSUVASTATIN CALCIUM 20 MG PO TABS: 20.0000 mg | ORAL_TABLET | Freq: Every day | ORAL | 3 refills | Status: DC

## 2021-05-24 NOTE — Assessment & Plan Note (Addendum)
Lab Results  Component Value Date   CHOL 259 (H) 05/19/2021   HDL 39 (L) 05/19/2021   LDLCALC 161 (H) 05/19/2021   TRIG 310 (H) 05/19/2021  -Rx. rosuvastatin

## 2021-05-24 NOTE — Patient Instructions (Signed)
Please have fasting labs drawn 2-3 days prior to your appointment so we can discuss the results during your office visit.  

## 2021-05-24 NOTE — Assessment & Plan Note (Addendum)
-  takes ibuprofen PRN and bupropion for prevention -<4 migraine days per month now since starting wellbutrin -discussed CGRP meds, but she isn't interested at this time -tried imitrex and maxalt previously without relief -she even had previous headache specialist and had botox injections for migraine

## 2021-05-24 NOTE — Assessment & Plan Note (Signed)
-  fasting glucose 126 -A1c added-on to her labs -will consider GLP-1 vs metformin for costs if A1c > 6.5%

## 2021-05-24 NOTE — Progress Notes (Signed)
Acute Office Visit  Subjective:    Patient ID: Tammie Sweeney, female    DOB: [birth year], 55 y.o.   MRN: [id number]  Chief Complaint  Patient presents with   Annual Exam    Physical needs refills     HPI Patient is in today for physical exam. She established care on 04/24/21.  She has migraines, and has been taking ibuprofen. She took imitrex and maxalt previously, but those didn't help. She had botox injections in her neck/posterior scalp, but that didn't help either. She has been taking wellbutrin for migraine, and that is helping some. She states she has less than 4 headache days per month since she started wellbutrin.   Past Medical History:  Diagnosis Date   Anxiety    Depression    Fracture closed, humerus, shaft 08/02/2011   GERD (gastroesophageal reflux disease)    Hiatal hernia    Hyperlipidemia    Migraine     Past Surgical History:  Procedure Laterality Date   INCONTINENCE SURGERY     left arm fracture repair     TONSILLECTOMY      History reviewed. No pertinent family history.  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   Vaping Use   Vaping Use: Never used  Substance and Sexual Activity   Alcohol use: Yes    Comment: maybe twice per year   Drug use: No   Sexual activity: Yes    Birth control/protection: I.U.D.  Other Topics Concern   Not on file  Social History Narrative   Not on file   Social Determinants of Health   Financial Resource Strain: Not on  file  Food Insecurity: Not on file  Transportation Needs: Not on file  Physical Activity: Not on file  Stress: Not on file  Social Connections: Not on file  Intimate Partner Violence: Not on file    Outpatient Medications Prior to Visit  Medication Sig Dispense Refill   buPROPion HCl ER, XL, 450 MG TB24 Take 450 mg by mouth daily. 30 tablet 4   citalopram (CELEXA) 40 MG tablet Take 1 tablet (40 mg total) by mouth daily. 90 tablet 0   docusate sodium (COLACE) 50 MG capsule Take by mouth 2 (two) times daily.     famotidine (PEPCID) 20 MG tablet Take 1 tablet (20 mg total) by mouth 2 (two) times daily. 60 tablet 1   levocetirizine (XYZAL) 5 MG tablet Take 1 tablet (5 mg total) by mouth daily. 90 tablet 0   lisinopril (ZESTRIL) 20 MG tablet Take 1 tablet (20 mg total) by mouth daily. 90 tablet 0   methocarbamol (ROBAXIN) 500 MG tablet Take 500 mg by mouth 2 (two) times daily as needed.     Multiple Vitamin (MULTI-VITAMIN DAILY PO) Take 1 tablet by mouth daily.     omeprazole (PRILOSEC) 40 MG capsule Take 1 capsule (40  mg total) by mouth 2 (two) times daily. 90 capsule 0   ibuprofen (ADVIL) 800 MG tablet Take 800 mg by mouth 3 (three) times daily as needed.     No facility-administered medications prior to visit.    Allergies  Allergen Reactions   Molds & Smuts Cough, Nausea Only, Other (See Comments), Photosensitivity and Shortness Of Breath    Other    Other Shortness Of Breath, Itching, Other (See Comments), Cough, Hypertension, Nausea And Vomiting, Palpitations, Photosensitivity and Tinitus    Pet dander    Pollen Extract Cough, Itching and Shortness Of Breath   Grass Extracts [Gramineae Pollens] Other (See Comments)    Other    Review of Systems  Constitutional: Negative.   HENT: Negative.    Eyes: Negative.   Respiratory: Negative.    Cardiovascular: Negative.   Gastrointestinal: Negative.   Endocrine: Negative.   Genitourinary: Negative.   Musculoskeletal: Negative.    Skin: Negative.   Allergic/Immunologic: Negative.   Neurological: Negative.   Hematological: Negative.   Psychiatric/Behavioral: Negative.        Objective:    Physical Exam Constitutional:      Appearance: Normal appearance. She is obese.  HENT:     Head: Normocephalic and atraumatic.     Right Ear: Tympanic membrane, ear canal and external ear normal.     Left Ear: Tympanic membrane, ear canal and external ear normal.     Ears:     Comments: Serous fluid behind R TM    Nose: Nose normal.     Mouth/Throat:     Mouth: Mucous membranes are moist.     Pharynx: Oropharynx is clear.  Eyes:     Extraocular Movements: Extraocular movements intact.     Conjunctiva/sclera: Conjunctivae normal.     Pupils: Pupils are equal, round, and reactive to light.  Cardiovascular:     Rate and Rhythm: Normal rate and regular rhythm.     Pulses: Normal pulses.     Heart sounds: Normal heart sounds.  Pulmonary:     Effort: Pulmonary effort is normal.     Breath sounds: Normal breath sounds.  Abdominal:     General: Abdomen is flat. Bowel sounds are normal.     Palpations: Abdomen is soft.     Comments: Umbilical hernia  Musculoskeletal:        General: Normal range of motion.     Cervical back: Normal range of motion and neck supple.  Skin:    General: Skin is warm and dry.     Capillary Refill: Capillary refill takes less than 2 seconds.  Neurological:     General: No focal deficit present.     Mental Status: She is alert and oriented to person, place, and time.     Cranial Nerves: No cranial nerve deficit.     Sensory: No sensory deficit.     Motor: No weakness.     Coordination: Coordination normal.     Gait: Gait normal.  Psychiatric:        Mood and Affect: Mood normal.        Behavior: Behavior normal.        Thought Content: Thought content normal.        Judgment: Judgment normal.    BP (!) 155/95 (BP Location: Right Arm, Patient Position: Sitting, Cuff Size: Large)    Pulse (!) 115   Temp 98.6 F (37 C) (Oral)   Ht 5' 4"  (1.626 m)   Wt 236 lb  1.9 oz (107.1 kg)   SpO2 95%   BMI 40.53 kg/m  Wt Readings from Last 3 Encounters:  05/24/21 236 lb 1.9 oz (107.1 kg)  04/24/21 233 lb (105.7 kg)  09/10/17 215 lb (97.5 kg)    Health Maintenance Due  Topic Date Due   COVID-19 Vaccine (1) Never done   PAP SMEAR-Modifier  Never done   COLONOSCOPY (Pts 45-36yr Insurance coverage will need to be confirmed)  Never done   MAMMOGRAM  11/07/2017    There are no preventive care reminders to display for this patient.   Lab Results  Component Value Date   TSH 1.168 01/04/2015   Lab Results  Component Value Date   WBC 7.6 05/19/2021   HGB 13.6 05/19/2021   HCT 40.6 05/19/2021   MCV 78 (L) 05/19/2021   PLT 323 05/19/2021   Lab Results  Component Value Date   NA 141 05/19/2021   K 4.7 05/19/2021   CO2 23 05/19/2021   GLUCOSE 126 (H) 05/19/2021   BUN 14 05/19/2021   CREATININE 1.01 (H) 05/19/2021   BILITOT 0.2 05/19/2021   ALKPHOS 67 05/19/2021   AST 18 05/19/2021   ALT 25 05/19/2021   PROT 7.0 05/19/2021   ALBUMIN 4.7 05/19/2021   CALCIUM 10.0 05/19/2021   ANIONGAP 5 01/04/2015   EGFR 67 05/19/2021   Lab Results  Component Value Date   CHOL 259 (H) 05/19/2021   Lab Results  Component Value Date   HDL 39 (L) 05/19/2021   Lab Results  Component Value Date   LDLCALC 161 (H) 05/19/2021   Lab Results  Component Value Date   TRIG 310 (H) 05/19/2021   No results found for: CHOLHDL No results found for: HGBA1C     Assessment & Plan:   Problem List Items Addressed This Visit       Cardiovascular and Mediastinum   Migraine    -takes ibuprofen PRN and bupropion for prevention -<4 migraine days per month now since starting wellbutrin -discussed CGRP meds, but she isn't interested at this time -tried imitrex and maxalt previously without relief -she even had previous headache specialist and had botox injections for migraine       Relevant Medications   rosuvastatin (CRESTOR) 20 MG tablet   ibuprofen (ADVIL) 800 MG tablet     Endocrine   IFG (impaired fasting glucose)    -fasting glucose 126 -A1c added-on to her labs -will consider GLP-1 vs metformin for costs if A1c > 6.5%      Relevant Orders   Hemoglobin A1c     Nervous and Auditory   Radial nerve palsy     Musculoskeletal and Integument   Cystic acne    -takes Bactrim DS daily -refilled today; has been taking this for about 3 years; originally started with Dr. FWilley Blade and it has bene working well      Relevant Medications   sulfamethoxazole-trimethoprim (BACTRIM DS) 800-160 MG tablet     Other   Mixed hyperlipidemia    Lab Results  Component Value Date   CHOL 259 (H) 05/19/2021   HDL 39 (L) 05/19/2021   LDLCALC 161 (H) 05/19/2021   TRIG 310 (H) 05/19/2021  -Rx. rosuvastatin      Relevant Medications   rosuvastatin (CRESTOR) 20 MG tablet   Other Relevant Orders   CBC with Differential/Platelet   CMP14+EGFR   Lipid Panel With LDL/HDL Ratio   Other Visit Diagnoses     Need for immunization against influenza    -  Primary   Relevant Orders   Flu Vaccine QUAD 46moIM (Fluarix, Fluzone & Alfiuria Quad PF) (Completed)        Meds ordered this encounter  Medications   rosuvastatin (CRESTOR) 20 MG tablet    Sig: Take 1 tablet (20 mg total) by mouth daily.    Dispense:  90 tablet    Refill:  3   sulfamethoxazole-trimethoprim (BACTRIM DS) 800-160 MG tablet    Sig: Take 1 tablet by mouth daily.    Dispense:  90 tablet    Refill:  3   ibuprofen (ADVIL) 800 MG tablet    Sig: Take 1 tablet (800 mg total) by mouth 3 (three) times daily as needed.    Dispense:  30 tablet    Refill:  2     JNoreene Larsson NP

## 2021-05-24 NOTE — Assessment & Plan Note (Signed)
-  takes Bactrim DS daily -refilled today; has been taking this for about 3 years; originally started with Dr. Willey Blade, and it has bene working well

## 2021-05-26 LAB — HEMOGLOBIN A1C
Est. average glucose Bld gHb Est-mCnc: 143 mg/dL
Hgb A1c MFr Bld: 6.6 % — ABNORMAL HIGH (ref 4.8–5.6)

## 2021-05-26 LAB — SPECIMEN STATUS REPORT

## 2021-08-31 ENCOUNTER — Ambulatory Visit: Payer: Self-pay | Admitting: Nurse Practitioner

## 2021-09-07 ENCOUNTER — Ambulatory Visit (INDEPENDENT_AMBULATORY_CARE_PROVIDER_SITE_OTHER): Payer: Self-pay | Admitting: Gastroenterology

## 2021-09-07 ENCOUNTER — Encounter (INDEPENDENT_AMBULATORY_CARE_PROVIDER_SITE_OTHER): Payer: Self-pay | Admitting: Gastroenterology

## 2021-09-07 ENCOUNTER — Ambulatory Visit: Payer: Self-pay | Admitting: Nurse Practitioner

## 2021-09-16 ENCOUNTER — Other Ambulatory Visit: Payer: Self-pay | Admitting: Internal Medicine

## 2021-09-16 DIAGNOSIS — K219 Gastro-esophageal reflux disease without esophagitis: Secondary | ICD-10-CM

## 2021-09-25 DIAGNOSIS — G43009 Migraine without aura, not intractable, without status migrainosus: Secondary | ICD-10-CM | POA: Insufficient documentation

## 2021-10-04 DIAGNOSIS — Z23 Encounter for immunization: Secondary | ICD-10-CM | POA: Diagnosis not present

## 2021-10-24 ENCOUNTER — Encounter (INDEPENDENT_AMBULATORY_CARE_PROVIDER_SITE_OTHER): Payer: Self-pay | Admitting: Gastroenterology

## 2021-10-24 ENCOUNTER — Other Ambulatory Visit (INDEPENDENT_AMBULATORY_CARE_PROVIDER_SITE_OTHER): Payer: Self-pay

## 2021-10-24 ENCOUNTER — Ambulatory Visit (INDEPENDENT_AMBULATORY_CARE_PROVIDER_SITE_OTHER): Payer: Self-pay | Admitting: Gastroenterology

## 2021-10-24 ENCOUNTER — Encounter (INDEPENDENT_AMBULATORY_CARE_PROVIDER_SITE_OTHER): Payer: Self-pay

## 2021-10-24 ENCOUNTER — Telehealth (INDEPENDENT_AMBULATORY_CARE_PROVIDER_SITE_OTHER): Payer: Self-pay

## 2021-10-24 ENCOUNTER — Other Ambulatory Visit: Payer: Self-pay

## 2021-10-24 VITALS — BP 145/92 | HR 116 | Temp 98.6°F | Ht 64.0 in | Wt 230.8 lb

## 2021-10-24 DIAGNOSIS — K219 Gastro-esophageal reflux disease without esophagitis: Secondary | ICD-10-CM

## 2021-10-24 DIAGNOSIS — Z1211 Encounter for screening for malignant neoplasm of colon: Secondary | ICD-10-CM

## 2021-10-24 DIAGNOSIS — R1310 Dysphagia, unspecified: Secondary | ICD-10-CM

## 2021-10-24 DIAGNOSIS — R0989 Other specified symptoms and signs involving the circulatory and respiratory systems: Secondary | ICD-10-CM

## 2021-10-24 DIAGNOSIS — R09A2 Foreign body sensation, throat: Secondary | ICD-10-CM | POA: Insufficient documentation

## 2021-10-24 MED ORDER — PANTOPRAZOLE SODIUM 40 MG PO TBEC: 40.0000 mg | DELAYED_RELEASE_TABLET | Freq: Two times a day (BID) | ORAL | 1 refills | Status: DC

## 2021-10-24 MED ORDER — PEG 3350-KCL-NA BICARB-NACL 420 G PO SOLR: 4000.0000 mL | ORAL | 0 refills | Status: DC
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
## 2021-10-24 NOTE — Patient Instructions (Signed)
Please stop your omeprazole and pepcid I have sent pantoprazole 40mg  for you to take twice a day. Please take one 30-45 minutes prior to breakfast and the second 30-45 minutes prior to dinner. Stay upright 2-3 hours after eating, prior to lying down and try to avoid trigger foods such as greasy, fried, spicy, tomato based foods, caffeine, chocolate and alcohol We will get you scheduled for screening colonoscopy and EGD for further evaluation of your swallowing  Follow up 3 months

## 2021-10-24 NOTE — Telephone Encounter (Signed)
Lamyia Cdebaca Ann Ricky Gallery, CMA  ?

## 2021-11-13 DIAGNOSIS — M25562 Pain in left knee: Secondary | ICD-10-CM | POA: Diagnosis not present

## 2021-11-20 ENCOUNTER — Encounter (INDEPENDENT_AMBULATORY_CARE_PROVIDER_SITE_OTHER): Payer: Self-pay

## 2021-11-22 ENCOUNTER — Other Ambulatory Visit (HOSPITAL_COMMUNITY)
Admission: RE | Admit: 2021-11-22 | Discharge: 2021-11-22 | Disposition: A | Discharge status: Home / Self Care | Payer: 59 | Source: Ambulatory Visit | Attending: Gastroenterology | Admitting: Gastroenterology

## 2021-11-22 DIAGNOSIS — E785 Hyperlipidemia, unspecified: Secondary | ICD-10-CM | POA: Diagnosis not present

## 2021-11-22 DIAGNOSIS — Z1211 Encounter for screening for malignant neoplasm of colon: Secondary | ICD-10-CM | POA: Diagnosis present

## 2021-11-22 DIAGNOSIS — D123 Benign neoplasm of transverse colon: Secondary | ICD-10-CM | POA: Diagnosis not present

## 2021-11-22 DIAGNOSIS — K5730 Diverticulosis of large intestine without perforation or abscess without bleeding: Secondary | ICD-10-CM | POA: Diagnosis not present

## 2021-11-22 DIAGNOSIS — K3189 Other diseases of stomach and duodenum: Secondary | ICD-10-CM | POA: Diagnosis not present

## 2021-11-22 DIAGNOSIS — D124 Benign neoplasm of descending colon: Secondary | ICD-10-CM | POA: Diagnosis not present

## 2021-11-22 DIAGNOSIS — F419 Anxiety disorder, unspecified: Secondary | ICD-10-CM | POA: Diagnosis not present

## 2021-11-22 DIAGNOSIS — K219 Gastro-esophageal reflux disease without esophagitis: Secondary | ICD-10-CM | POA: Insufficient documentation

## 2021-11-22 DIAGNOSIS — G43909 Migraine, unspecified, not intractable, without status migrainosus: Secondary | ICD-10-CM | POA: Diagnosis not present

## 2021-11-22 DIAGNOSIS — R1310 Dysphagia, unspecified: Secondary | ICD-10-CM | POA: Diagnosis present

## 2021-11-22 DIAGNOSIS — F17210 Nicotine dependence, cigarettes, uncomplicated: Secondary | ICD-10-CM | POA: Diagnosis not present

## 2021-11-22 DIAGNOSIS — K449 Diaphragmatic hernia without obstruction or gangrene: Secondary | ICD-10-CM | POA: Diagnosis not present

## 2021-11-22 DIAGNOSIS — F32A Depression, unspecified: Secondary | ICD-10-CM | POA: Diagnosis not present

## 2021-11-22 DIAGNOSIS — D128 Benign neoplasm of rectum: Secondary | ICD-10-CM | POA: Diagnosis not present

## 2021-11-22 DIAGNOSIS — Z79899 Other long term (current) drug therapy: Secondary | ICD-10-CM | POA: Diagnosis not present

## 2021-11-22 LAB — PREGNANCY, URINE: Preg Test, Ur: NEGATIVE

## 2021-11-23 ENCOUNTER — Telehealth (INDEPENDENT_AMBULATORY_CARE_PROVIDER_SITE_OTHER): Payer: Self-pay

## 2021-11-23 MED ORDER — PEG 3350-KCL-NA BICARB-NACL 420 G PO SOLR: 4000.0000 mL | ORAL | 0 refills | Status: DC

## 2021-11-23 NOTE — Telephone Encounter (Signed)
Tammie Sweeney, CMA  ?

## 2021-11-24 ENCOUNTER — Other Ambulatory Visit: Payer: Self-pay

## 2021-11-24 ENCOUNTER — Ambulatory Visit (HOSPITAL_COMMUNITY)
Admission: RE | Admit: 2021-11-24 | Discharge: 2021-11-24 | Disposition: A | Discharge status: Home / Self Care | Payer: 59 | Attending: Gastroenterology | Admitting: Gastroenterology

## 2021-11-24 ENCOUNTER — Encounter (HOSPITAL_COMMUNITY)
Admission: RE | Disposition: A | Discharge status: Home / Self Care | Payer: Self-pay | Source: Home / Self Care | Attending: Gastroenterology

## 2021-11-24 ENCOUNTER — Ambulatory Visit (HOSPITAL_BASED_OUTPATIENT_CLINIC_OR_DEPARTMENT_OTHER): Payer: 59 | Admitting: Anesthesiology

## 2021-11-24 ENCOUNTER — Encounter (HOSPITAL_COMMUNITY): Payer: Self-pay | Admitting: Gastroenterology

## 2021-11-24 ENCOUNTER — Ambulatory Visit (HOSPITAL_COMMUNITY): Payer: 59 | Admitting: Anesthesiology

## 2021-11-24 DIAGNOSIS — F458 Other somatoform disorders: Secondary | ICD-10-CM

## 2021-11-24 DIAGNOSIS — K621 Rectal polyp: Secondary | ICD-10-CM | POA: Diagnosis not present

## 2021-11-24 DIAGNOSIS — D128 Benign neoplasm of rectum: Secondary | ICD-10-CM | POA: Insufficient documentation

## 2021-11-24 DIAGNOSIS — D124 Benign neoplasm of descending colon: Secondary | ICD-10-CM | POA: Diagnosis not present

## 2021-11-24 DIAGNOSIS — G43909 Migraine, unspecified, not intractable, without status migrainosus: Secondary | ICD-10-CM | POA: Insufficient documentation

## 2021-11-24 DIAGNOSIS — Z7689 Persons encountering health services in other specified circumstances: Secondary | ICD-10-CM

## 2021-11-24 DIAGNOSIS — K5730 Diverticulosis of large intestine without perforation or abscess without bleeding: Secondary | ICD-10-CM | POA: Diagnosis not present

## 2021-11-24 DIAGNOSIS — F17210 Nicotine dependence, cigarettes, uncomplicated: Secondary | ICD-10-CM | POA: Insufficient documentation

## 2021-11-24 DIAGNOSIS — K635 Polyp of colon: Secondary | ICD-10-CM

## 2021-11-24 DIAGNOSIS — K449 Diaphragmatic hernia without obstruction or gangrene: Secondary | ICD-10-CM | POA: Diagnosis not present

## 2021-11-24 DIAGNOSIS — K3189 Other diseases of stomach and duodenum: Secondary | ICD-10-CM | POA: Diagnosis not present

## 2021-11-24 DIAGNOSIS — R1310 Dysphagia, unspecified: Secondary | ICD-10-CM

## 2021-11-24 DIAGNOSIS — R69 Illness, unspecified: Secondary | ICD-10-CM | POA: Diagnosis not present

## 2021-11-24 DIAGNOSIS — K219 Gastro-esophageal reflux disease without esophagitis: Secondary | ICD-10-CM

## 2021-11-24 DIAGNOSIS — R569 Unspecified convulsions: Secondary | ICD-10-CM

## 2021-11-24 DIAGNOSIS — Z1211 Encounter for screening for malignant neoplasm of colon: Secondary | ICD-10-CM

## 2021-11-24 DIAGNOSIS — E782 Mixed hyperlipidemia: Secondary | ICD-10-CM

## 2021-11-24 DIAGNOSIS — F32A Depression, unspecified: Secondary | ICD-10-CM | POA: Insufficient documentation

## 2021-11-24 DIAGNOSIS — R09A2 Foreign body sensation, throat: Secondary | ICD-10-CM

## 2021-11-24 DIAGNOSIS — G5630 Lesion of radial nerve, unspecified upper limb: Secondary | ICD-10-CM

## 2021-11-24 DIAGNOSIS — D123 Benign neoplasm of transverse colon: Secondary | ICD-10-CM | POA: Insufficient documentation

## 2021-11-24 DIAGNOSIS — Z79899 Other long term (current) drug therapy: Secondary | ICD-10-CM | POA: Insufficient documentation

## 2021-11-24 DIAGNOSIS — E785 Hyperlipidemia, unspecified: Secondary | ICD-10-CM | POA: Insufficient documentation

## 2021-11-24 DIAGNOSIS — K2289 Other specified disease of esophagus: Secondary | ICD-10-CM | POA: Diagnosis not present

## 2021-11-24 DIAGNOSIS — R7301 Impaired fasting glucose: Secondary | ICD-10-CM

## 2021-11-24 DIAGNOSIS — F419 Anxiety disorder, unspecified: Secondary | ICD-10-CM | POA: Insufficient documentation

## 2021-11-24 DIAGNOSIS — R0989 Other specified symptoms and signs involving the circulatory and respiratory systems: Secondary | ICD-10-CM

## 2021-11-24 DIAGNOSIS — L700 Acne vulgaris: Secondary | ICD-10-CM

## 2021-11-24 HISTORY — PX: BIOPSY: SHX5522

## 2021-11-24 HISTORY — PX: COLONOSCOPY WITH PROPOFOL: SHX5780

## 2021-11-24 HISTORY — PX: ESOPHAGEAL DILATION: SHX303

## 2021-11-24 HISTORY — PX: POLYPECTOMY: SHX5525

## 2021-11-24 HISTORY — PX: ESOPHAGOGASTRODUODENOSCOPY (EGD) WITH PROPOFOL: SHX5813

## 2021-11-24 LAB — HM COLONOSCOPY

## 2021-11-24 SURGERY — COLONOSCOPY WITH PROPOFOL
Anesthesia: General

## 2021-11-24 MED ORDER — PROPOFOL 10 MG/ML IV BOLUS
INTRAVENOUS | Status: DC | PRN
  Administered 2021-11-24 (×3): 50 mg via INTRAVENOUS
  Administered 2021-11-24: 100 mg via INTRAVENOUS

## 2021-11-24 MED ORDER — LACTATED RINGERS IV SOLN: INTRAVENOUS | Status: DC | PRN

## 2021-11-24 MED ORDER — PHENYLEPHRINE 40 MCG/ML (10ML) SYRINGE FOR IV PUSH (FOR BLOOD PRESSURE SUPPORT)
PREFILLED_SYRINGE | INTRAVENOUS | Status: DC | PRN
  Administered 2021-11-24 (×2): 80 ug via INTRAVENOUS

## 2021-11-24 MED ORDER — PROPOFOL 500 MG/50ML IV EMUL
INTRAVENOUS | Status: DC | PRN
  Administered 2021-11-24: 150 ug/kg/min via INTRAVENOUS

## 2021-11-24 MED ORDER — LACTATED RINGERS IV SOLN: INTRAVENOUS | Status: DC

## 2021-11-24 MED ORDER — STERILE WATER FOR IRRIGATION IR SOLN
Status: DC | PRN
  Administered 2021-11-24: .6 mL

## 2021-11-24 MED ORDER — LIDOCAINE HCL (CARDIAC) PF 100 MG/5ML IV SOSY
PREFILLED_SYRINGE | INTRAVENOUS | Status: DC | PRN
  Administered 2021-11-24: 50 mg via INTRAVENOUS

## 2021-11-24 NOTE — Discharge Instructions (Addendum)
You are being discharged to home.  °Resume your previous diet.  °We are waiting for your pathology results.  °Continue your present medications.  °Your physician has recommended a repeat colonoscopy for surveillance based on pathology results.  °

## 2021-11-24 NOTE — Transfer of Care (Signed)
Immediate Anesthesia Transfer of Care Note ? ?Patient: Tammie Sweeney ? ?Procedure(s) Performed: COLONOSCOPY WITH PROPOFOL ?ESOPHAGOGASTRODUODENOSCOPY (EGD) WITH PROPOFOL ?BIOPSY ?ESOPHAGEAL DILATION ?POLYPECTOMY ? ?Patient Location: PACU ? ?Anesthesia Type:General ? ?Level of Consciousness: awake ? ?Airway & Oxygen Therapy: Patient Spontanous Breathing ? ?Post-op Assessment: Report given to RN and Post -op Vital signs reviewed and stable ? ?Post vital signs: Reviewed and stable ? ?Last Vitals:  ?Vitals Value Taken Time  ?BP    ?Temp    ?Pulse    ?Resp    ?SpO2    ? ? ?Last Pain:  ?Vitals:  ? 11/24/21 1153  ?TempSrc: Oral  ?PainSc: 0-No pain  ?   ? ?Patients Stated Pain Goal: 7 (11/24/21 1153) ? ?Complications: No notable events documented. ?

## 2021-11-24 NOTE — Op Note (Signed)
[facility] ?Patient Name: Tammie Sweeney ?Procedure Date: 11/24/2021 12:34 PM ?MRN: [id number] ?Date of Birth: 1965/12/19 ?Attending MD: Maylon Peppers ,  ?CSN: [id number] ?Age: 55 ?Admit Type: Outpatient ?Procedure:                Upper GI endoscopy ?Indications:              Dysphagia, Globus sensation ?Providers:                Maylon Peppers, Janeece Riggers, RN, Suzan Garibaldi.  ?                          Risa Grill, Technician ?Referring MD:              ?Medicines:                Monitored Anesthesia Care ?Complications:            No immediate complications. ?Estimated Blood Loss:     Estimated blood loss: none. ?Procedure:                Pre-Anesthesia Assessment: ?                          - Prior to the procedure, a History and Physical  ?                          was performed, and patient medications, allergies  ?                          and sensitivities were reviewed. The patient's  ?                          tolerance of previous anesthesia was reviewed. ?                          - The risks and benefits of the procedure and the  ?                          sedation options and risks were discussed with the  ?                          patient. All questions were answered and informed  ?                          consent was obtained. ?                          - ASA Grade Assessment: II - A patient with mild  ?                          systemic disease. ?                          After obtaining informed consent, the endoscope was  ?                          passed under direct vision. Throughout the  ?  procedure, the patient's blood pressure, pulse, and  ?                          oxygen saturations were monitored continuously. The  ?                          GIF-H190 ([id number]) scope was introduced through the  ?                          mouth, and advanced to the second part of duodenum.  ?                          The upper GI endoscopy was accomplished without  ?                           difficulty. The patient tolerated the procedure  ?                          well. ?Scope In: 1:18:23 PM ?Scope Out: 1:35:09 PM ?Total Procedure Duration: 0 hours 16 minutes 46 seconds  ?Findings: ?     No endoscopic abnormality was evident in the esophagus to explain the  ?     patient's complaint of dysphagia. It was decided, however, to proceed  ?     with dilation of the entire esophagus. A guidewire was placed and the  ?     scope was withdrawn. Dilation was performed with a Savary dilator with  ?     mild resistance at 18 mm. mucosal disruption was seen upon reinspection  ?     of the upper third. Biopsies were obtained from the proximal and distal  ?     esophagus with cold forceps for histology of eosinophilic esophagitis. ?     A 1 cm hiatal hernia was present. ?     Diffuse atrophic mucosa was found in the gastric antrum. Biopsies from  ?     body and antrum were taken with a cold forceps for Helicobacter pylori  ?     testing. ?     The examined duodenum was normal. ?Impression:               - No endoscopic esophageal abnormality to explain  ?                          patient's dysphagia. Esophagus dilated. Dilated.  ?                          Biopsied. ?                          - 1 cm hiatal hernia. ?                          - Gastric mucosal atrophy. Biopsied. ?                          - Normal examined duodenum. ?Moderate Sedation: ?     Per Anesthesia Care ?Recommendation:           -  Discharge patient to home (ambulatory). ?                          - Resume previous diet. ?                          - Await pathology results. ?                          - Continue present medications. ?Procedure Code(s):        --- Professional --- ?                          [phone], Esophagogastroduodenoscopy, flexible,  ?                          transoral; with insertion of guide wire followed by  ?                          passage of dilator(s) through esophagus over guide  ?                           wire ?                          43239, 59, Esophagogastroduodenoscopy, flexible,  ?                          transoral; with biopsy, single or multiple ?Diagnosis Code(s):        --- Professional --- ?                          R13.10, Dysphagia, unspecified ?                          K44.9, Diaphragmatic hernia without obstruction or  ?                          gangrene ?                          K31.89, Other diseases of stomach and duodenum ?                          F45.8, Other somatoform disorders ?CPT copyright 2019 American Medical Association. All rights reserved. ?The codes documented in this report are preliminary and upon coder review may  ?be revised to meet current compliance requirements. ?Maylon Peppers, MD ?Maylon Peppers,  ?11/24/2021 1:40:35 PM ?This report has been signed electronically. ?Number of Addenda: 0 ?

## 2021-11-24 NOTE — H&P (Signed)
Tammie Sweeney is an 56 y.o. female.   ?Chief Complaint: throat discomfort, dysphagia and CRC screeening ?HPI: Tammie Sweeney is a 56 y.o. female with past medical history of anxiety/depression, GERD, hiatal hernia, HLD, migraines, who comes to the hospital for evaluation of throat discomfort, dysphagia and colorectal cancer screening. ? ?Patient has presented a chronic history of GERD for which she has been managed with PPI twice daily.  Recently she felt some improvement while taking pantoprazole 40 mg twice a day.  Denies any nausea, vomiting, fever or chills.  She states that she feels frequent discomfort in the back of her throat even if she is not swallowing and occasional dysphagia. ? ?Never had a colonoscopy in the past.  No family history of colorectal cancer. ? ?Past Medical History:  ?Diagnosis Date  ? Anxiety   ? Depression   ? Fracture closed, humerus, shaft 08/02/2011  ? GERD (gastroesophageal reflux disease)   ? Hiatal hernia   ? Hyperlipidemia   ? Migraine   ? ? ?Past Surgical History:  ?Procedure Laterality Date  ? INCONTINENCE SURGERY    ? left arm fracture repair    ? TONSILLECTOMY    ? ? ?History reviewed. No pertinent family history. ?Social History:  reports that she has been smoking cigarettes. She has a 10.00 pack-year smoking history. She has never used smokeless tobacco. She reports current alcohol use. She reports that she does not use drugs. ? ?Allergies:  ?Allergies  ?Allergen Reactions  ? Molds & Smuts Cough, Nausea Only, Other (See Comments), Photosensitivity and Shortness Of Breath  ?  Other ?  ? Other Shortness Of Breath, Itching, Other (See Comments), Cough, Hypertension, Nausea And Vomiting, Palpitations, Photosensitivity and Tinitus  ?  Pet dander ?  ? Pollen Extract Cough, Itching and Shortness Of Breath  ? Grass Extracts [Gramineae Pollens] Other (See Comments)  ?  Other  ? ? ?Medications Prior to Admission  ?Medication Sig Dispense Refill  ? buPROPion HCl ER, XL, 450 MG TB24 Take  450 mg by mouth daily. 30 tablet 4  ? citalopram (CELEXA) 40 MG tablet TAKE ONE TABLET ('40MG'$  TOTAL) BY MOUTH DAILY 90 tablet 0  ? docusate sodium (COLACE) 50 MG capsule Take by mouth 2 (two) times daily.    ? ibuprofen (ADVIL) 800 MG tablet Take 1 tablet (800 mg total) by mouth 3 (three) times daily as needed. 30 tablet 2  ? levocetirizine (XYZAL) 5 MG tablet TAKE ONE TABLET ('5MG'$  TOTAL) BY MOUTH DAILY 90 tablet 0  ? lisinopril (ZESTRIL) 20 MG tablet TAKE ONE TABLET ('20MG'$  TOTAL) BY MOUTH DAILY 90 tablet 0  ? methocarbamol (ROBAXIN) 500 MG tablet Take 500 mg by mouth 2 (two) times daily as needed.    ? Multiple Vitamin (MULTI-VITAMIN DAILY PO) Take 1 tablet by mouth daily.    ? pantoprazole (PROTONIX) 40 MG tablet Take 1 tablet (40 mg total) by mouth 2 (two) times daily. 90 tablet 1  ? polyethylene glycol-electrolytes (TRILYTE) 420 g solution Take 4,000 mLs by mouth as directed. 4000 mL 0  ? rosuvastatin (CRESTOR) 20 MG tablet Take 1 tablet (20 mg total) by mouth daily. 90 tablet 3  ? sulfamethoxazole-trimethoprim (BACTRIM DS) 800-160 MG tablet Take 1 tablet by mouth daily. (Patient taking differently: Take 1 tablet by mouth 2 (two) times daily.) 90 tablet 3  ? ? ?No results found for this or any previous visit (from the past 48 hour(s)). ?No results found. ? ?Review of Systems  ?Constitutional: Negative.   ?  HENT:  Positive for sore throat and trouble swallowing.   ?Eyes: Negative.   ?Respiratory: Negative.    ?Cardiovascular: Negative.   ?Gastrointestinal: Negative.   ?Endocrine: Negative.   ?Genitourinary: Negative.   ?Musculoskeletal: Negative.   ?Skin: Negative.   ?Allergic/Immunologic: Negative.   ?Neurological: Negative.   ?Hematological: Negative.   ?Psychiatric/Behavioral: Negative.    ? ?Blood pressure 132/84, temperature 98.6 ?F (37 ?C), temperature source Oral, resp. rate 17, height '5\' 4"'$  (1.626 m), weight 102.1 kg, SpO2 95 %. ?Physical Exam  ?GENERAL: The patient is AO x3, in no acute distress. ?HEENT:  Head is normocephalic and atraumatic. EOMI are intact. Mouth is well hydrated and without lesions. ?NECK: Supple. No masses ?LUNGS: Clear to auscultation. No presence of rhonchi/wheezing/rales. Adequate chest expansion ?HEART: RRR, normal s1 and s2. ?ABDOMEN: Soft, nontender, no guarding, no peritoneal signs, and nondistended. BS +. No masses. ?EXTREMITIES: Without any cyanosis, clubbing, rash, lesions or edema. ?NEUROLOGIC: AOx3, no focal motor deficit. ?SKIN: no jaundice, no rashes ? ?Assessment/Plan ? DHANI IMEL is a 56 y.o. female with past medical history of anxiety/depression, GERD, hiatal hernia, HLD, migraines, who comes to the hospital for evaluation of throat discomfort, dysphagia and colorectal cancer screening.  We will proceed with EGD and colonoscopy. ? ?Harvel Quale, MD ?11/24/2021, 1:09 PM ? ? ? ?

## 2021-11-24 NOTE — Op Note (Signed)
[facility] ?Patient Name: Tammie Sweeney ?Procedure Date: 11/24/2021 1:37 PM ?MRN: [id number] ?Date of Birth: Feb 23, 1966 ?Attending MD: Maylon Peppers ,  ?CSN: [id number] ?Age: 56 ?Admit Type: Outpatient ?Procedure:                Colonoscopy ?Indications:              Screening for colorectal malignant neoplasm ?Providers:                Maylon Peppers, Janeece Riggers, RN, Suzan Garibaldi.  ?                          Risa Grill, Technician ?Referring MD:              ?Medicines:                Monitored Anesthesia Care ?Complications:            No immediate complications. ?Estimated Blood Loss:     Estimated blood loss: none. ?Procedure:                Pre-Anesthesia Assessment: ?                          - Prior to the procedure, a History and Physical  ?                          was performed, and patient medications, allergies  ?                          and sensitivities were reviewed. The patient's  ?                          tolerance of previous anesthesia was reviewed. ?                          - The risks and benefits of the procedure and the  ?                          sedation options and risks were discussed with the  ?                          patient. All questions were answered and informed  ?                          consent was obtained. ?                          - ASA Grade Assessment: II - A patient with mild  ?                          systemic disease. ?                          After obtaining informed consent, the colonoscope  ?                          was passed under direct vision. Throughout the  ?  procedure, the patient's blood pressure, pulse, and  ?                          oxygen saturations were monitored continuously. The  ?                          PCF-HQ190L ([id number]) scope was introduced through  ?                          the anus and advanced to the the cecum, identified  ?                          by appendiceal orifice and ileocecal valve. The  ?                           colonoscopy was performed without difficulty. The  ?                          patient tolerated the procedure well. The quality  ?                          of the bowel preparation was good. ?Scope In: 1:41:47 PM ?Scope Out: 2:18:56 PM ?Scope Withdrawal Time: 0 hours 30 minutes 40 seconds  ?Total Procedure Duration: 0 hours 37 minutes 9 seconds  ?Findings: ?     The perianal and digital rectal examinations were normal. ?     Seven sessile and semi-sessile polyps were found in the rectum,  ?     descending colon and transverse colon. The polyps were 2 to 10 mm in  ?     size. These polyps were removed with a cold snare. Resection and  ?     retrieval were complete. ?     A few small and large-mouthed diverticula were found in the sigmoid  ?     colon and descending colon. ?     The retroflexed view of the distal rectum and anal verge was normal and  ?     showed no anal or rectal abnormalities. ?Impression:               - Seven 2 to 10 mm polyps in the rectum, in the  ?                          descending colon and in the transverse colon,  ?                          removed with a cold snare. Resected and retrieved. ?                          - Diverticulosis in the sigmoid colon and in the  ?                          descending colon. ?                          - The distal rectum and anal verge are normal on  ?  retroflexion view. ?Moderate Sedation: ?     Per Anesthesia Care ?Recommendation:           - Discharge patient to home (ambulatory). ?                          - Resume previous diet. ?                          - Await pathology results. ?                          - Repeat colonoscopy for surveillance based on  ?                          pathology results. ?Procedure Code(s):        --- Professional --- ?                          [phone], Colonoscopy, flexible; with removal of  ?                          tumor(s), polyp(s), or other lesion(s) by snare  ?                           technique ?Diagnosis Code(s):        --- Professional --- ?                          Z12.11, Encounter for screening for malignant  ?                          neoplasm of colon ?                          K62.1, Rectal polyp ?                          K63.5, Polyp of colon ?                          K57.30, Diverticulosis of large intestine without  ?                          perforation or abscess without bleeding ?CPT copyright 2019 American Medical Association. All rights reserved. ?The codes documented in this report are preliminary and upon coder review may  ?be revised to meet current compliance requirements. ?Maylon Peppers, MD ?Maylon Peppers,  ?11/24/2021 2:26:18 PM ?This report has been signed electronically. ?Number of Addenda: 0 ?

## 2021-11-24 NOTE — Anesthesia Postprocedure Evaluation (Signed)
Anesthesia Post Note ? ?Patient: Tammie Sweeney ? ?Procedure(s) Performed: COLONOSCOPY WITH PROPOFOL ?ESOPHAGOGASTRODUODENOSCOPY (EGD) WITH PROPOFOL ?BIOPSY ?ESOPHAGEAL DILATION ?POLYPECTOMY ? ?Patient location during evaluation: Phase II ?Anesthesia Type: General ?Level of consciousness: awake ?Pain management: pain level controlled ?Vital Signs Assessment: post-procedure vital signs reviewed and stable ?Respiratory status: spontaneous breathing and respiratory function stable ?Cardiovascular status: blood pressure returned to baseline and stable ?Postop Assessment: no headache and no apparent nausea or vomiting ?Anesthetic complications: no ?Comments: Late entry ? ? ?No notable events documented. ? ? ?Last Vitals:  ?Vitals:  ? 11/24/21 1155 11/24/21 1421  ?BP: 132/84 (!) 110/54  ?Pulse:  94  ?Resp:  19  ?Temp:  (!) 36.4 ?C  ?SpO2:  94%  ?  ?Last Pain:  ?Vitals:  ? 11/24/21 1421  ?TempSrc: Oral  ?PainSc: 0-No pain  ? ? ?  ?  ?  ?  ?  ?  ? ?Louann Sjogren ? ? ? ? ?

## 2021-11-24 NOTE — Anesthesia Preprocedure Evaluation (Signed)
Anesthesia Evaluation  ?Patient identified by MRN, date of birth, ID band ?Patient awake ? ? ? ?Reviewed: ?Allergy & Precautions, H&P , NPO status , Patient's Chart, lab work & pertinent test results, reviewed documented beta blocker date and time  ? ?Airway ?Mallampati: II ? ?TM Distance: >3 FB ?Neck ROM: full ? ? ? Dental ?no notable dental hx. ? ?  ?Pulmonary ?sleep apnea , Current Smoker,  ?  ?Pulmonary exam normal ?breath sounds clear to auscultation ? ? ? ? ? ? Cardiovascular ?Exercise Tolerance: Good ?negative cardio ROS ? ? ?Rhythm:regular Rate:Normal ? ? ?  ?Neuro/Psych ? Headaches, PSYCHIATRIC DISORDERS Anxiety Depression  Neuromuscular disease   ? GI/Hepatic ?Neg liver ROS, hiatal hernia, GERD  Medicated,  ?Endo/Other  ?Morbid obesity ? Renal/GU ?negative Renal ROS  ?negative genitourinary ?  ?Musculoskeletal ? ? Abdominal ?  ?Peds ? Hematology ?negative hematology ROS ?(+)   ?Anesthesia Other Findings ? ? Reproductive/Obstetrics ?negative OB ROS ? ?  ? ? ? ? ? ? ? ? ? ? ? ? ? ?  ?  ? ? ? ? ? ? ? ? ?Anesthesia Physical ?Anesthesia Plan ? ?ASA: 3 ? ?Anesthesia Plan: General  ? ?Post-op Pain Management:   ? ?Induction:  ? ?PONV Risk Score and Plan: Propofol infusion ? ?Airway Management Planned:  ? ?Additional Equipment:  ? ?Intra-op Plan:  ? ?Post-operative Plan:  ? ?Informed Consent: I have reviewed the patients History and Physical, chart, labs and discussed the procedure including the risks, benefits and alternatives for the proposed anesthesia with the patient or authorized representative who has indicated his/her understanding and acceptance.  ? ? ? ?Dental Advisory Given ? ?Plan Discussed with: CRNA ? ?Anesthesia Plan Comments:   ? ? ? ? ? ? ?Anesthesia Quick Evaluation ? ?

## 2021-11-28 LAB — SURGICAL PATHOLOGY

## 2021-11-29 ENCOUNTER — Encounter (HOSPITAL_COMMUNITY): Payer: Self-pay | Admitting: Gastroenterology

## 2021-11-29 ENCOUNTER — Encounter (INDEPENDENT_AMBULATORY_CARE_PROVIDER_SITE_OTHER): Payer: Self-pay | Admitting: *Deleted

## 2021-12-01 ENCOUNTER — Other Ambulatory Visit (INDEPENDENT_AMBULATORY_CARE_PROVIDER_SITE_OTHER): Payer: Self-pay | Admitting: Gastroenterology

## 2021-12-04 ENCOUNTER — Encounter (INDEPENDENT_AMBULATORY_CARE_PROVIDER_SITE_OTHER): Payer: Self-pay | Admitting: *Deleted

## 2021-12-15 ENCOUNTER — Other Ambulatory Visit (INDEPENDENT_AMBULATORY_CARE_PROVIDER_SITE_OTHER): Payer: Self-pay | Admitting: Gastroenterology

## 2021-12-20 ENCOUNTER — Other Ambulatory Visit (INDEPENDENT_AMBULATORY_CARE_PROVIDER_SITE_OTHER): Payer: Self-pay | Admitting: Gastroenterology

## 2021-12-20 NOTE — Telephone Encounter (Signed)
I called and left a message of Pharmacy vm, asked that they return call to the office.  ?

## 2021-12-22 NOTE — Telephone Encounter (Signed)
Per Randall Hiss at [facility] they have not sent a request for lansoprazole. He says the pantoprazole per insurance only allows # 90 for a year, and would need a Prior Auth in order to see if they will approve. Per Randall Hiss he will send the Key for the PA. ?

## 2021-12-25 ENCOUNTER — Other Ambulatory Visit (INDEPENDENT_AMBULATORY_CARE_PROVIDER_SITE_OTHER): Payer: Self-pay | Admitting: Gastroenterology

## 2021-12-25 NOTE — Telephone Encounter (Signed)
I called and left a message asked the patient please return call.  ? ?Per [place] all ppi are max quantity of #90 with patient insurance. Per [facility] let patient know this and let her know she can get it cheaper with Good rx card through [facility]. Call patient and see what she would like [place] to do. ?

## 2021-12-26 NOTE — Telephone Encounter (Signed)
Patient will use good rx for pantoprazole and will call back if any issues with cost of med.  ?

## 2022-01-01 DIAGNOSIS — D751 Secondary polycythemia: Secondary | ICD-10-CM | POA: Insufficient documentation

## 2022-01-01 DIAGNOSIS — E1165 Type 2 diabetes mellitus with hyperglycemia: Secondary | ICD-10-CM | POA: Insufficient documentation

## 2022-01-02 DIAGNOSIS — E559 Vitamin D deficiency, unspecified: Secondary | ICD-10-CM | POA: Insufficient documentation

## 2022-01-23 ENCOUNTER — Encounter (INDEPENDENT_AMBULATORY_CARE_PROVIDER_SITE_OTHER): Payer: Self-pay | Admitting: Gastroenterology

## 2022-01-23 ENCOUNTER — Ambulatory Visit (INDEPENDENT_AMBULATORY_CARE_PROVIDER_SITE_OTHER): Payer: 59 | Admitting: Gastroenterology

## 2022-01-23 VITALS — BP 144/92 | HR 112 | Temp 98.3°F | Ht 64.0 in | Wt 233.0 lb

## 2022-01-23 DIAGNOSIS — K219 Gastro-esophageal reflux disease without esophagitis: Secondary | ICD-10-CM | POA: Diagnosis not present

## 2022-01-23 DIAGNOSIS — R0989 Other specified symptoms and signs involving the circulatory and respiratory systems: Secondary | ICD-10-CM

## 2022-01-23 DIAGNOSIS — Z860100 Personal history of colon polyps, unspecified: Secondary | ICD-10-CM | POA: Insufficient documentation

## 2022-01-23 NOTE — Patient Instructions (Signed)
Please continue your pantoprazole '40mg'$  twice daily.  Avoid greasy, spicy, fried, citrus foods, and be mindful that caffeine, carbonated drinks, chocolate and alcohol can increase reflux symptoms Stay upright 2-3 hours after eating, prior to lying down and avoid eating late in the evenings. Please let me know if you have any new or worsening GI symptoms.  I have provided a goodRx card for you to use to get your pantoprazole, this is used in place of insurance and usually will make the cost lower, if you decide you would like me to send this to a different pharmacy (meds with GoodRx are usually cheaper at [facility]) I can do that we well  Follow up 1 year

## 2022-01-23 NOTE — Progress Notes (Signed)
Referring Provider: No ref. provider found Primary Care Physician:  Noreene Larsson, NP Primary GI Physician: Jenetta Downer  Chief Complaint  Patient presents with   Follow-up    Patient here today for a follow up. She is still having some issues with Jerrye Bushy.She says she has a feeling of mucus stuck in her throat. She is taking Pantoprazole 40 mg bid.    HPI:   Tammie Sweeney is a 56 y.o. female with past medical history of anxiety/depression, GERD, hiatal hernia, HLD, migraines.   Patient presenting today for follow up of GERD.  History: Last seen 10/24/21 with c/o thickness in her throat and coughing, maintained on omeprazole '40mg'$  BID at that time, started on pepcid '20mg'$  QHS by PCP without much improvement. Saw ENT who felt that symptoms were reflux related, also endorsed some dysphagia at times with breads and thicker liquids like milkshakes. Patient started on pantoprazole '40mg'$  BID and scheduled for EGD  Present:  Today, patient reports she is still having sensation of mucus/thickness in her throat, though feels it is improved since EGD and change in PPI. She denies any heartburn or acid reflux, even notes that she has missed her evening PPI dose a few times and did not experience any. Dysphagia has also improved, having little to no issue with this currently. States that coughing has decreased quite a bit as well since change in PPI. She reports that she spent $50 on 1 month supply of protonix BID at the pharmacy. Appetite is good, no weight loss or early satiety.  She tries to avoid greasy, spicy foods, carbonated drinks but does like to eat tomatoes and has to be careful with these as these tend to worsen her reflux symptoms. Overall, feels that UGI symptoms are much improved.   Last Colonoscopy:11/24/21 Seven 2 to 10 mm polyps in the rectum, in the descending colon and in the transverse colon-3 tubular adenomas, 2 SSL and 2 hyperplastic polyps - Diverticulosis in the sigmoid colon and in the  descending colon. - The distal rectum and anal verge are normal on retroflexion view. Last Endoscopy:11/24/21 No endoscopic esophageal abnormality to explain patient's dysphagia. Esophagus dilated.Dilated. Biopsied-suggestive of reflux disease - 1 cm hiatal hernia. - Gastric mucosal atrophy. Biopsied-normal - Normal examined duodenum.  Recommendations:  Repeat colonoscopy in 2026  Past Medical History:  Diagnosis Date   Anxiety    Depression    Fracture closed, humerus, shaft 08/02/2011   GERD (gastroesophageal reflux disease)    Hiatal hernia    Hyperlipidemia    Migraine     Past Surgical History:  Procedure Laterality Date   BIOPSY  11/24/2021   Procedure: BIOPSY;  Surgeon: Harvel Quale, MD;  [facility];  Service: Gastroenterology;;   COLONOSCOPY WITH PROPOFOL N/A 11/24/2021   Procedure: COLONOSCOPY WITH PROPOFOL;  Surgeon: Harvel Quale, MD;  Location: [facility];  Service: Gastroenterology;  Laterality: N/A;  205   ESOPHAGEAL DILATION  11/24/2021   Procedure: ESOPHAGEAL DILATION;  Surgeon: Montez Morita, Quillian Quince, MD;  Location: [facility];  Service: Gastroenterology;;   ESOPHAGOGASTRODUODENOSCOPY (EGD) WITH PROPOFOL N/A 11/24/2021   Procedure: ESOPHAGOGASTRODUODENOSCOPY (EGD) WITH PROPOFOL;  Surgeon: Harvel Quale, MD;  Location: [facility];  Service: Gastroenterology;  Laterality: N/A;   INCONTINENCE SURGERY     left arm fracture repair     POLYPECTOMY  11/24/2021   Procedure: POLYPECTOMY;  Surgeon: Harvel Quale, MD;  Location: [facility];  Service: Gastroenterology;;   TONSILLECTOMY  Current Outpatient Medications  Medication Sig Dispense Refill   aspirin EC 81 MG tablet Take 81 mg by mouth daily. Swallow whole.     buPROPion HCl ER, XL, 450 MG TB24 Take 450 mg by mouth daily. 30 tablet 4   citalopram (CELEXA) 40 MG tablet TAKE ONE TABLET ('40MG'$  TOTAL) BY MOUTH DAILY 90 tablet 0   docusate  sodium (COLACE) 50 MG capsule Take by mouth 2 (two) times daily.     ibuprofen (ADVIL) 800 MG tablet Take 1 tablet (800 mg total) by mouth 3 (three) times daily as needed. 30 tablet 2   levocetirizine (XYZAL) 5 MG tablet TAKE ONE TABLET ('5MG'$  TOTAL) BY MOUTH DAILY 90 tablet 0   lisinopril (ZESTRIL) 20 MG tablet TAKE ONE TABLET ('20MG'$  TOTAL) BY MOUTH DAILY 90 tablet 0   meloxicam (MOBIC) 7.5 MG tablet Take 7.5 mg by mouth daily.     metFORMIN (GLUCOPHAGE) 500 MG tablet Take 500 mg by mouth 2 (two) times daily with a meal.     Multiple Vitamin (MULTI-VITAMIN DAILY PO) Take 1 tablet by mouth daily.     pantoprazole (PROTONIX) 40 MG tablet TAKE 1 TABLET BY MOUTH TWICE A DAY 60 tablet 2   predniSONE (DELTASONE) 5 MG tablet Take 5 mg by mouth daily with breakfast. Prn knee pain.     rosuvastatin (CRESTOR) 20 MG tablet Take 1 tablet (20 mg total) by mouth daily. 90 tablet 3   sulfamethoxazole-trimethoprim (BACTRIM DS) 800-160 MG tablet Take 1 tablet by mouth daily. (Patient taking differently: Take 1 tablet by mouth 2 (two) times daily.) 90 tablet 3   No current facility-administered medications for this visit.    Allergies as of 01/23/2022 - Review Complete 01/23/2022  Allergen Reaction Noted   Molds & smuts Cough, Nausea Only, Other (See Comments), Photosensitivity, and Shortness Of Breath 04/21/2019   Other Shortness Of Breath, Itching, Other (See Comments), Cough, Hypertension, Nausea And Vomiting, Palpitations, Photosensitivity, and Tinitus 04/21/2019   Pollen extract Cough, Itching, and Shortness Of Breath 03/27/2021   Grass extracts [gramineae pollens] Other (See Comments) 04/21/2019    History reviewed. No pertinent family history.  Social History   Socioeconomic History   Marital status: Single    Spouse name: Not on file   Number of children: 0   Years of education: Not on file   Highest education level: Not on file  Occupational History   Occupation: DUI educator   Occupation:  counselor    Comment: with resolution counseling; worked at [employer] previously  Tobacco Use   Smoking status: Every Day    Packs/day: 0.50    Years: 40.00    Pack years: 20.00    Types: Cigarettes   Smokeless tobacco: Never   Tobacco comments:    1/2 pack a day since a teenager.   Vaping Use   Vaping Use: Never used  Substance and Sexual Activity   Alcohol use: Yes    Comment: maybe twice per year   Drug use: No   Sexual activity: Yes    Birth control/protection: I.U.D.  Other Topics Concern   Not on file  Social History Narrative   Not on file   Social Determinants of Health   Financial Resource Strain: Not on file  Food Insecurity: Not on file  Transportation Needs: Not on file  Physical Activity: Not on file  Stress: Not on file  Social Connections: Not on file   Review of systems General: negative for malaise, night sweats, fever, chills,  weight loss Neck: Negative for lumps, goiter, pain and significant neck swelling Resp: Negative for cough, wheezing, dyspnea at rest CV: Negative for chest pain, leg swelling, palpitations, orthopnea GI: denies melena, hematochezia, nausea, vomiting, diarrhea, constipation, dysphagia, odyonophagia, early satiety or unintentional weight loss. +globus sensation MSK: Negative for joint pain or swelling, back pain, and muscle pain. Derm: Negative for itching or rash Psych: Denies depression, anxiety, memory loss, confusion. No homicidal or suicidal ideation.  Heme: Negative for prolonged bleeding, bruising easily, and swollen nodes. Endocrine: Negative for cold or heat intolerance, polyuria, polydipsia and goiter. Neuro: negative for tremor, gait imbalance, syncope and seizures. The remainder of the review of systems is noncontributory.  Physical Exam: BP (!) 144/92 (BP Location: Right Arm, Patient Position: Sitting, Cuff Size: Large)   Pulse (!) 112   Temp 98.3 F (36.8 C) (Oral)   Ht '5\' 4"'$  (1.626 m)   Wt 233 lb (105.7 kg)    BMI 39.99 kg/m  General:   Alert and oriented. No distress noted. Pleasant and cooperative.  Head:  Normocephalic and atraumatic. Eyes:  Conjuctiva clear without scleral icterus. Mouth:  Oral mucosa pink and moist. Good dentition. No lesions. Heart: Normal rate and rhythm, s1 and s2 heart sounds present.  Lungs: Clear lung sounds in all lobes. Respirations equal and unlabored. Abdomen:  +BS, soft, non-tender and non-distended. No rebound or guarding. No HSM or masses noted. Derm: No palmar erythema or jaundice Msk:  Symmetrical without gross deformities. Normal posture. Extremities:  Without edema. Neurologic:  Alert and  oriented x4 Psych:  Alert and cooperative. Normal mood and affect.  Invalid input(s): 6 MONTHS   ASSESSMENT: DELLA SCRIVENER is a 56 y.o. female presenting today for follow up of GERD/globus sensation.  EGD in march with findings of No endoscopic esophageal abnormality to explain patient's dysphagia. Esophagus dilated and biopsied with biopsy suggestive of reflux disease, 1 cm hiatal hernia, Gastric mucosal atrophy with normal gastric biopsies. Pt remains on pantoprazole '40mg'$  BID and feels that heartburn/acid reflux is very well controlled, dysphagia is much improved, as is coughing. Still having some globus sensation though this is also improved some. Discussed continued reflux precautions with the patient. She avoid greasy/spicy/carbonated foods/drinks. As PPI was $50 for 1 month supply at her pharmacy, will provide GoodRx card to help with cost.   Colonoscopy in march 2023 with multiple polyp.  Pt aware of results of pathology (Dr. Jenetta Downer was unable to reach patient via telephone after pathology resulted, letter was sent previously). Plan to repeat colonoscopy in 3 years.   PLAN:  Continue PPI BID, GoodRx card provided 2. Continue reflux precautions 3. Pt to make me aware of worsening GI symptoms.   All questions were answered, patient verbalized understanding and  is in agreement with plan as outlined above.   Follow Up: 1 year  Tasmin Exantus L. Alver Sorrow, MSN, APRN, AGNP-C Adult-Gerontology Nurse Practitioner [facility]

## 2022-02-23 ENCOUNTER — Other Ambulatory Visit (INDEPENDENT_AMBULATORY_CARE_PROVIDER_SITE_OTHER): Payer: Self-pay | Admitting: Gastroenterology

## 2022-06-15 ENCOUNTER — Ambulatory Visit (INDEPENDENT_AMBULATORY_CARE_PROVIDER_SITE_OTHER): Payer: 59 | Admitting: Family Medicine

## 2022-06-15 ENCOUNTER — Encounter: Payer: Self-pay | Admitting: Family Medicine

## 2022-06-15 VITALS — BP 114/72 | HR 101 | Temp 96.8°F | Ht 64.0 in | Wt 216.0 lb

## 2022-06-15 DIAGNOSIS — Z23 Encounter for immunization: Secondary | ICD-10-CM

## 2022-06-15 DIAGNOSIS — I10 Essential (primary) hypertension: Secondary | ICD-10-CM

## 2022-06-15 DIAGNOSIS — M1712 Unilateral primary osteoarthritis, left knee: Secondary | ICD-10-CM | POA: Diagnosis not present

## 2022-06-15 DIAGNOSIS — E782 Mixed hyperlipidemia: Secondary | ICD-10-CM

## 2022-06-15 DIAGNOSIS — L700 Acne vulgaris: Secondary | ICD-10-CM | POA: Diagnosis not present

## 2022-06-15 DIAGNOSIS — R718 Other abnormality of red blood cells: Secondary | ICD-10-CM

## 2022-06-15 DIAGNOSIS — E119 Type 2 diabetes mellitus without complications: Secondary | ICD-10-CM | POA: Insufficient documentation

## 2022-06-15 DIAGNOSIS — Z862 Personal history of diseases of the blood and blood-forming organs and certain disorders involving the immune mechanism: Secondary | ICD-10-CM | POA: Diagnosis not present

## 2022-06-15 MED ORDER — SULFAMETHOXAZOLE-TRIMETHOPRIM 800-160 MG PO TABS: 1.0000 | ORAL_TABLET | Freq: Two times a day (BID) | ORAL | 3 refills | Status: DC

## 2022-06-15 MED ORDER — MELOXICAM 7.5 MG PO TABS: 7.5000 mg | ORAL_TABLET | Freq: Every day | ORAL | 3 refills | Status: DC | PRN

## 2022-06-15 NOTE — Patient Instructions (Signed)
Labs when you can.  Medications as prescribed.  Follow up in 6 months.  Take care  Dr. Lacinda Axon

## 2022-06-18 DIAGNOSIS — M179 Osteoarthritis of knee, unspecified: Secondary | ICD-10-CM | POA: Insufficient documentation

## 2022-06-18 DIAGNOSIS — I10 Essential (primary) hypertension: Secondary | ICD-10-CM | POA: Insufficient documentation

## 2022-06-18 NOTE — Assessment & Plan Note (Signed)
Mobic as prescribed.

## 2022-06-18 NOTE — Assessment & Plan Note (Signed)
I have refilled her Bactrim.  I recommended against long-term use.  Discussed dermatology referral.

## 2022-06-18 NOTE — Progress Notes (Signed)
Subjective:  Patient ID: Tammie Sweeney, female    DOB: 03-18-1966  Age: 56 y.o. MRN: [id number]  CC: Chief Complaint  Patient presents with   Establish Care    Former pt of Dr.Gray @ [facility]. Needs refill on medications. Needing to know if she is to be on Crestor still    HPI:  56 year old female with migraine, sleep apnea, GERD, type 2 diabetes, cystic acne, hyperlipidemia presents to establish care.  Patient's most recent A1c was 6.6.  She is currently on metformin 500 mg BID and tolerating.  Needs A1c.  Last lipid panel revealed that her lipids were uncontrolled.  Needs lipid panel today.  Is currently on Crestor 20 mg daily.  Hypertension is stable on lisinopril 20 mg daily.  Patient has cystic acne.  She has been on Bactrim for quite some time.  I have advised her that I think there are better options than using Bactrim long-term.  She will consider.  Patient has knee osteoarthritis.  Has been prescribed prednisone in the past and has low-dose prednisone at home.  I advised her that I do not recommend her using prednisone on a regular basis.  Recommend anti-inflammatory.  Patient Active Problem List   Diagnosis Date Noted   Essential hypertension 06/18/2022   Knee osteoarthritis 06/18/2022   Type 2 diabetes mellitus without complications ([place]) 27/51/7001   History of colonic polyps 01/23/2022   Mixed hyperlipidemia 05/24/2021   Cystic acne 05/24/2021   Migraine 05/24/2021   Gastroesophageal reflux disease 04/24/2021   Sleep apnea 04/24/2021    Social Hx   Social History   Socioeconomic History   Marital status: Single    Spouse name: Not on file   Number of children: 0   Years of education: Not on file   Highest education level: Not on file  Occupational History   Occupation: DUI educator   Occupation: counselor    Comment: with resolution counseling; worked at [employer] previously  Tobacco Use   Smoking status: Every Day    Packs/day: 0.50     Years: 40.00    Total pack years: 20.00    Types: Cigarettes   Smokeless tobacco: Never   Tobacco comments:    1/2 pack a day since a teenager.   Vaping Use   Vaping Use: Never used  Substance and Sexual Activity   Alcohol use: Yes    Comment: maybe twice per year   Drug use: No   Sexual activity: Yes    Birth control/protection: I.U.D.  Other Topics Concern   Not on file  Social History Narrative   Not on file   Social Determinants of Health   Financial Resource Strain: Not on file  Food Insecurity: Not on file  Transportation Needs: Not on file  Physical Activity: Not on file  Stress: Not on file  Social Connections: Not on file    Review of Systems Per HPI  Objective:  BP 114/72   Pulse (!) 101   Temp (!) 96.8 F (36 C)   Ht 5' 4"  (1.626 m)   Wt 216 lb (98 kg)   SpO2 98%   BMI 37.08 kg/m      06/15/2022    2:03 PM 01/23/2022    9:41 AM 11/24/2021    2:21 PM  BP/Weight  Systolic BP 749 449 675  Diastolic BP 72 92 54  Wt. (Lbs) 216 233   BMI 37.08 kg/m2 39.99 kg/m2     Physical Exam Vitals  and nursing note reviewed.  Constitutional:      General: She is not in acute distress.    Appearance: Normal appearance. She is obese.  HENT:     Head: Normocephalic and atraumatic.  Eyes:     General:        Right eye: No discharge.        Left eye: No discharge.     Conjunctiva/sclera: Conjunctivae normal.  Cardiovascular:     Rate and Rhythm: Normal rate and regular rhythm.  Pulmonary:     Effort: Pulmonary effort is normal.     Breath sounds: Normal breath sounds. No wheezing, rhonchi or rales.  Neurological:     Mental Status: She is alert.  Psychiatric:        Mood and Affect: Mood normal.        Behavior: Behavior normal.     Lab Results  Component Value Date   WBC 7.6 05/19/2021   HGB 13.6 05/19/2021   HCT 40.6 05/19/2021   PLT 323 05/19/2021   GLUCOSE 126 (H) 05/19/2021   CHOL 259 (H) 05/19/2021   TRIG 310 (H) 05/19/2021   HDL 39 (L)  05/19/2021   LDLCALC 161 (H) 05/19/2021   ALT 25 05/19/2021   AST 18 05/19/2021   NA 141 05/19/2021   K 4.7 05/19/2021   CL 102 05/19/2021   CREATININE 1.01 (H) 05/19/2021   BUN 14 05/19/2021   CO2 23 05/19/2021   TSH 1.168 01/04/2015   HGBA1C 6.6 (H) 05/19/2021     Assessment & Plan:   Problem List Items Addressed This Visit       Cardiovascular and Mediastinum   Essential hypertension    Stable.  Continue lisinopril.        Endocrine   Type 2 diabetes mellitus without complications ([place]) - Primary    Has been stable.  Awaiting A1c.  Continue metformin.      Relevant Orders   CMP14+EGFR   Hemoglobin A1c     Musculoskeletal and Integument   Knee osteoarthritis    Mobic as prescribed.      Relevant Medications   meloxicam (MOBIC) 7.5 MG tablet   Cystic acne    I have refilled her Bactrim.  I recommended against long-term use.  Discussed dermatology referral.      Relevant Medications   sulfamethoxazole-trimethoprim (BACTRIM DS) 800-160 MG tablet     Other   Mixed hyperlipidemia    Needs lipid panel to assess control.  Continue Crestor 20 mg daily.      Relevant Orders   Lipid panel   Other Visit Diagnoses     History of anemia       Relevant Orders   CBC   Microcytosis       Relevant Orders   Iron, TIBC and Ferritin Panel   Need for vaccination       Relevant Orders   Flu Vaccine QUAD 6+ mos PF IM (Fluarix Quad PF) (Completed)       Meds ordered this encounter  Medications   meloxicam (MOBIC) 7.5 MG tablet    Sig: Take 1 tablet (7.5 mg total) by mouth daily as needed for pain.    Dispense:  30 tablet    Refill:  3   sulfamethoxazole-trimethoprim (BACTRIM DS) 800-160 MG tablet    Sig: Take 1 tablet by mouth 2 (two) times daily.    Dispense:  180 tablet    Refill:  3    Follow-up:  Return in  about 6 months (around 12/15/2022).  [place]

## 2022-06-18 NOTE — Assessment & Plan Note (Signed)
Needs lipid panel to assess control.  Continue Crestor 20 mg daily.

## 2022-06-18 NOTE — Assessment & Plan Note (Signed)
Has been stable.  Awaiting A1c.  Continue metformin.

## 2022-06-18 NOTE — Assessment & Plan Note (Signed)
Stable. Continue lisinopril

## 2022-08-24 ENCOUNTER — Telehealth: Payer: Self-pay | Admitting: *Deleted

## 2022-08-24 NOTE — Telephone Encounter (Signed)
Patient requesting new script for Wellbutrin sent to [facility] 

## 2022-08-27 ENCOUNTER — Other Ambulatory Visit: Payer: Self-pay | Admitting: Family Medicine

## 2022-08-27 MED ORDER — BUPROPION HCL ER (XL) 450 MG PO TB24: 450.0000 mg | ORAL_TABLET | Freq: Every day | ORAL | 1 refills | Status: DC

## 2022-08-28 NOTE — Telephone Encounter (Signed)
Left message to return call 

## 2022-08-28 NOTE — Telephone Encounter (Signed)
Cook, Jayce G, DO   ? ?Rx sent.   ? ?

## 2022-08-31 NOTE — Telephone Encounter (Signed)
Patient notified

## 2022-09-14 ENCOUNTER — Ambulatory Visit: Payer: 59 | Admitting: Family Medicine

## 2022-11-28 ENCOUNTER — Encounter (INDEPENDENT_AMBULATORY_CARE_PROVIDER_SITE_OTHER): Payer: Self-pay | Admitting: Gastroenterology

## 2022-12-14 ENCOUNTER — Ambulatory Visit: Payer: 59 | Admitting: Family Medicine

## 2023-01-02 ENCOUNTER — Telehealth (INDEPENDENT_AMBULATORY_CARE_PROVIDER_SITE_OTHER): Payer: Self-pay

## 2023-01-02 NOTE — Telephone Encounter (Signed)
Insurance not wanting to pay for bid pantoprazole per patient. Per Leeroy Bock patient Genella Rife symptoms were not controlled on once daily pantoprazole. Per Leeroy Bock we can have patient try once per day dose again to see if her symptoms are controlled with just once daily, but if they weren't in the past they probably will not be controlled now on once per day dosage. Chelsea gave the patient a good rx card to use at [facility] at her last office visit. I spoke with the patient and made her aware she could use the good Rx card to get her bid dosage, other wise she would have to use her insurance card and we would have to go back to once per day dosage. Patient states understanding.

## 2023-01-09 ENCOUNTER — Telehealth: Payer: Self-pay

## 2023-01-09 NOTE — Telephone Encounter (Signed)
Pt is needing lab work before appt on 06/04   Pt call back [phone]

## 2023-01-10 NOTE — Telephone Encounter (Signed)
Cook, Jayce G, DO     CBC, CMP, Lipid, A1C, Urine Microalbumin.  Thank you  Dr. Adriana Simas

## 2023-01-11 ENCOUNTER — Ambulatory Visit: Payer: 59 | Admitting: Nurse Practitioner

## 2023-01-11 ENCOUNTER — Other Ambulatory Visit: Payer: Self-pay

## 2023-01-11 ENCOUNTER — Encounter: Payer: Self-pay | Admitting: Nurse Practitioner

## 2023-01-11 VITALS — BP 124/81 | HR 97 | Temp 97.0°F | Ht 64.0 in | Wt 219.0 lb

## 2023-01-11 DIAGNOSIS — E782 Mixed hyperlipidemia: Secondary | ICD-10-CM

## 2023-01-11 DIAGNOSIS — E119 Type 2 diabetes mellitus without complications: Secondary | ICD-10-CM

## 2023-01-11 DIAGNOSIS — M26622 Arthralgia of left temporomandibular joint: Secondary | ICD-10-CM | POA: Insufficient documentation

## 2023-01-11 DIAGNOSIS — J0100 Acute maxillary sinusitis, unspecified: Secondary | ICD-10-CM

## 2023-01-11 DIAGNOSIS — F17210 Nicotine dependence, cigarettes, uncomplicated: Secondary | ICD-10-CM | POA: Diagnosis not present

## 2023-01-11 DIAGNOSIS — I10 Essential (primary) hypertension: Secondary | ICD-10-CM

## 2023-01-11 DIAGNOSIS — Z862 Personal history of diseases of the blood and blood-forming organs and certain disorders involving the immune mechanism: Secondary | ICD-10-CM

## 2023-01-11 MED ORDER — AMOXICILLIN-POT CLAVULANATE 875-125 MG PO TABS: 1.0000 | ORAL_TABLET | Freq: Two times a day (BID) | ORAL | 0 refills | Status: DC

## 2023-01-11 MED ORDER — CITALOPRAM HYDROBROMIDE 40 MG PO TABS: ORAL_TABLET | ORAL | 1 refills | Status: DC

## 2023-01-11 NOTE — Telephone Encounter (Signed)
Patient seen at appt today, labs were reordered.

## 2023-01-11 NOTE — Progress Notes (Signed)
Subjective:    Patient ID: Tammie Sweeney, female    DOB: Jan 14, 1966, 57 y.o.   MRN: [id number]  HPI Ear pain with left side neck lymph node area dull pain  Left side face, head, jaw, tooth or gum using throat spray, ear drops, ibuprofen, orajel for about 2 weeks Generalized pain around the left ear into the lateral neck area.  Occasional will have headache in the left scalp area.  Patient noted she has been clenching her jaw when under stress.  States it feels better if she puts pressure in that area and holds it. No fever.  Pressure and pain in the cheekbone below the left eye.  No cough shortness of breath or wheezing.  No sore throat.  Some hoarseness at times.  Smokes half pack per day.    01/11/2023   11:26 AM  Depression screen PHQ 2/9  Decreased Interest 1  Down, Depressed, Hopeless 1  PHQ - 2 Score 2  Altered sleeping 2  Tired, decreased energy 1  Change in appetite 1  Feeling bad or failure about yourself  1  Trouble concentrating 1  Moving slowly or fidgety/restless 0  Suicidal thoughts 0  PHQ-9 Score 8  Difficult doing work/chores Somewhat difficult      01/11/2023   11:26 AM 04/24/2021    3:31 PM  GAD 7 : Generalized Anxiety Score  Nervous, Anxious, on Edge 1 1  Control/stop worrying 0 0  Worry too much - different things 0 0  Trouble relaxing 0 1  Restless 0 1  Easily annoyed or irritable 1 0  Afraid - awful might happen 0 0  Total GAD 7 Score 2 3  Anxiety Difficulty Somewhat difficult Not difficult at all           Objective:   Physical Exam NAD.  Alert, oriented.  TMs retracted bilaterally, no erythema.  Nasal mucosa pale and mildly boggy more on the left side.  Positive left maxillary sinus tenderness to percussion.  No tenderness with movement of the pinna or tragus.  Distinct tenderness noted in the left preauricular area at the TMJ, no crepitus or clicking noted with opening and closing of the mouth.  Also tenderness in the left lateral neck area  along the sternocleidomastoid into the left trapezius.  Moderate soft lymphadenopathy noted in the bilateral anterior cervical area.  Lungs clear.  Heart regular rate rhythm. Today's Vitals   01/11/23 1120  BP: 124/81  Pulse: 97  Temp: (!) 97 F (36.1 C)  SpO2: 99%  Weight: 219 lb (99.3 kg)  Height: 5\' 4"  (1.626 m)   Body mass index is 37.59 kg/m.        Assessment & Plan:   Problem List Items Addressed This Visit       Other   Cigarette smoker   Tenderness of left temporomandibular joint   Other Visit Diagnoses     Acute non-recurrent maxillary sinusitis    -  Primary   Relevant Medications   amoxicillin-clavulanate (AUGMENTIN) 875-125 MG tablet      Meds ordered this encounter  Medications   citalopram (CELEXA) 40 MG tablet    Sig: TAKE ONE TABLET (40MG  TOTAL) BY MOUTH DAILY    Dispense:  90 tablet    Refill:  1   amoxicillin-clavulanate (AUGMENTIN) 875-125 MG tablet    Sig: Take 1 tablet by mouth 2 (two) times daily.    Dispense:  14 tablet    Refill:  0  Order Specific Question:   Supervising Provider    Answer:   Lilyan Punt A [[id number]]   Start Augmentin as directed.  Continue levocetirizine as directed.  Also recommend OTC steroid nasal spray as directed. Refill citalopram per patient request, she has been on this long-term from her previous provider. Ice/heat applications to the neck and upper shoulder area.  Recommend massage therapy.  Also continue massage to the TMJ area.  Stretching exercises.  Patient has a TENS unit at home, recommend this for the upper back area. Strongly recommend preventive health physical in the next few months.  Patient states she was unable to get her previous lab work done, will have this reordered. Recheck here if symptoms worsen or persist.

## 2023-01-15 NOTE — Progress Notes (Signed)
[facility] Quality Team Note  Name: Tammie Sweeney Date of Birth: 1965-12-29 MRN: [id number] Date: 01/15/2023  [facility] Quality Team has reviewed this patient's chart, please see recommendations below:  [facility] Quality Other; (Called to offer Ret[facility] Eye screening event at [facility] 01/17/2023, left voicemail.))

## 2023-01-24 ENCOUNTER — Ambulatory Visit (INDEPENDENT_AMBULATORY_CARE_PROVIDER_SITE_OTHER): Payer: 59 | Admitting: Gastroenterology

## 2023-01-29 ENCOUNTER — Ambulatory Visit: Payer: 59 | Admitting: Family Medicine

## 2023-02-18 ENCOUNTER — Encounter: Payer: 59 | Admitting: Family Medicine

## 2023-02-18 ENCOUNTER — Ambulatory Visit: Payer: 59 | Admitting: Family Medicine

## 2023-02-18 ENCOUNTER — Other Ambulatory Visit (HOSPITAL_COMMUNITY): Payer: Self-pay | Admitting: Nurse Practitioner

## 2023-02-18 ENCOUNTER — Ambulatory Visit (INDEPENDENT_AMBULATORY_CARE_PROVIDER_SITE_OTHER): Payer: 59 | Admitting: Nurse Practitioner

## 2023-02-18 VITALS — BP 116/78 | HR 100 | Ht 63.0 in | Wt 216.2 lb

## 2023-02-18 DIAGNOSIS — Z1151 Encounter for screening for human papillomavirus (HPV): Secondary | ICD-10-CM

## 2023-02-18 DIAGNOSIS — Z01419 Encounter for gynecological examination (general) (routine) without abnormal findings: Secondary | ICD-10-CM | POA: Diagnosis not present

## 2023-02-18 DIAGNOSIS — Z124 Encounter for screening for malignant neoplasm of cervix: Secondary | ICD-10-CM | POA: Diagnosis not present

## 2023-02-18 DIAGNOSIS — Z122 Encounter for screening for malignant neoplasm of respiratory organs: Secondary | ICD-10-CM

## 2023-02-18 DIAGNOSIS — Z1231 Encounter for screening mammogram for malignant neoplasm of breast: Secondary | ICD-10-CM

## 2023-02-18 NOTE — Progress Notes (Unsigned)
Subjective:    Patient ID: Tammie Sweeney, female    DOB: May 24, 1966, 57 y.o.   MRN: [id number]  HPI The patient comes in today for a wellness visit.    A review of their health history was completed.  A review of medications was also completed.  Any needed refills; yes  Eating habits: try to eat healthy  Falls/  MVA accidents in past few months: no  Regular exercise: very little   Specialist pt sees on regular basis: no  Preventative health issues were discussed.   Additional concerns: left neck jaw pain; see previous note.  Would like to try a muscle relaxant to see if this would help.  Has had slight relief with anti-inflammatory. Patient is smoked half a pack per day for the past 40 years.  Is willing to do a lung cancer screening. GERD is stable on pantoprazole.  Had a normal EGD last year.  Started on medication by GI specialist. Denies any vaginal bleeding for the past several years.  Has been with the same female sexual partner for the past 6 years.  Defers need for STD testing. Is due for her vision and dental exams.  Insurance has been an issue.   Review of Systems  Constitutional:  Negative for activity change, appetite change and fatigue.  HENT:  Negative for sore throat and trouble swallowing.   Respiratory:  Negative for cough, chest tightness, shortness of breath and wheezing.   Cardiovascular:  Negative for chest pain.  Gastrointestinal:  Negative for abdominal distention, abdominal pain, constipation, diarrhea, nausea and vomiting.       Hiatal hernia causes some difficulty with swallowing sensation.   Genitourinary:  Negative for difficulty urinating, dysuria, enuresis, frequency, genital sores, pelvic pain, urgency, vaginal bleeding and vaginal discharge.      02/18/2023    1:45 PM  Depression screen PHQ 2/9  Decreased Interest 0  Down, Depressed, Hopeless 1  PHQ - 2 Score 1  Altered sleeping 1  Tired, decreased energy 1  Change in appetite 0   Feeling bad or failure about yourself  1  Trouble concentrating 0  Moving slowly or fidgety/restless 0  Suicidal thoughts 0  PHQ-9 Score 4  Difficult doing work/chores Somewhat difficult      02/18/2023    1:46 PM 01/11/2023   11:26 AM 04/24/2021    3:31 PM  GAD 7 : Generalized Anxiety Score  Nervous, Anxious, on Edge 2 1 1   Control/stop worrying 1 0 0  Worry too much - different things 0 0 0  Trouble relaxing 1 0 1  Restless 0 0 1  Easily annoyed or irritable 0 1 0  Afraid - awful might happen 0 0 0  Total GAD 7 Score 4 2 3   Anxiety Difficulty Somewhat difficult Somewhat difficult Not difficult at all         Objective:   Physical Exam Vitals and nursing note reviewed.  Constitutional:      General: She is not in acute distress.    Appearance: She is well-developed.  Neck:     Thyroid: No thyromegaly.     Trachea: No tracheal deviation.     Comments: Thyroid non tender to palpation. No mass or goiter noted.  Cardiovascular:     Rate and Rhythm: Normal rate and regular rhythm.     Heart sounds: Normal heart sounds. No murmur heard. Pulmonary:     Effort: Pulmonary effort is normal.     Breath sounds: Normal  breath sounds.  Chest:  Breasts:    Right: No swelling, inverted nipple, mass, skin change or tenderness.     Left: No swelling, inverted nipple, mass, skin change or tenderness.  Abdominal:     General: There is no distension.     Palpations: Abdomen is soft.     Tenderness: There is no abdominal tenderness.  Genitourinary:    Comments: External GU: no vulvar lesions, rash or discoloration. Vagina: no discharge. Cervix: normal in appearance. No CMT. Bimanual exam: no tenderness or obvious masses.  Musculoskeletal:     Cervical back: Normal range of motion and neck supple.  Lymphadenopathy:     Cervical: No cervical adenopathy.     Upper Body:     Right upper body: No supraclavicular, axillary or pectoral adenopathy.     Left upper body: No  supraclavicular, axillary or pectoral adenopathy.  Skin:    General: Skin is warm and dry.  Neurological:     Mental Status: She is alert and oriented to person, place, and time.  Psychiatric:        Mood and Affect: Mood normal.        Behavior: Behavior normal.        Thought Content: Thought content normal.        Judgment: Judgment normal.    Today's Vitals   02/18/23 1342  BP: 116/78  Pulse: 100  SpO2: 98%  Weight: 216 lb 3.2 oz (98.1 kg)  Height: 5\' 3"  (1.6 m)   Body mass index is 38.3 kg/m.         Assessment & Plan:  Well woman exam - Plan: IGP, Aptima HPV  Screening for HPV (human papillomavirus) - Plan: IGP, Aptima HPV  Screening for cervical cancer - Plan: IGP, Aptima HPV  Screening for lung cancer - Plan: Ambulatory referral to Pulmonology Meds ordered this encounter  Medications   tiZANidine (ZANAFLEX) 4 MG tablet    Sig: Take 1 tablet (4 mg total) by mouth every 6 (six) hours as needed for muscle spasms.    Dispense:  30 tablet    Refill:  0    Order Specific Question:   Supervising Provider    Answer:   Lilyan Punt A [[id number]]   buPROPion HCl ER, XL, 450 MG TB24    Sig: Take 1 tablet (450 mg total) by mouth daily.    Dispense:  90 tablet    Refill:  1    Order Specific Question:   Supervising Provider    Answer:   Lilyan Punt A [[id number]]   lisinopril (ZESTRIL) 20 MG tablet    Sig: TAKE ONE TABLET (20MG  TOTAL) BY MOUTH DAILY    Dispense:  90 tablet    Refill:  1    Order Specific Question:   Supervising Provider    Answer:   Lilyan Punt A [[id number]]   pantoprazole (PROTONIX) 40 MG tablet    Sig: Take 1 tablet (40 mg total) by mouth 2 (two) times daily. Prn acid reflux    Dispense:  180 tablet    Refill:  1    Order Specific Question:   Supervising Provider    Answer:   Lilyan Punt A [[id number]]   RF regular medications. Trial of Tizanidine for TMJ tightness. Also recommend massage to the affected area. Limit NSAIDs due to GERD. Recommend  activity such as walking. Discussed healthy diet.  Mammogram scheduled. Defers tetanus booster today. Refer for lung cancer screening. Recommend Shingrix at local pharmacy.  Return in about 6 months (around 08/20/2023).

## 2023-02-19 ENCOUNTER — Encounter: Payer: Self-pay | Admitting: Nurse Practitioner

## 2023-02-19 MED ORDER — BUPROPION HCL ER (XL) 450 MG PO TB24: 450.0000 mg | ORAL_TABLET | Freq: Every day | ORAL | 1 refills | Status: DC

## 2023-02-19 MED ORDER — TIZANIDINE HCL 4 MG PO TABS: 4.0000 mg | ORAL_TABLET | Freq: Four times a day (QID) | ORAL | 0 refills | Status: AC | PRN

## 2023-02-19 MED ORDER — LISINOPRIL 20 MG PO TABS: ORAL_TABLET | ORAL | 1 refills | Status: DC

## 2023-02-19 MED ORDER — PANTOPRAZOLE SODIUM 40 MG PO TBEC: 40.0000 mg | DELAYED_RELEASE_TABLET | Freq: Two times a day (BID) | ORAL | 1 refills | Status: DC

## 2023-02-20 LAB — IGP, APTIMA HPV: HPV Aptima: NEGATIVE

## 2023-03-04 ENCOUNTER — Ambulatory Visit (HOSPITAL_COMMUNITY): Payer: 59

## 2023-03-19 ENCOUNTER — Ambulatory Visit (INDEPENDENT_AMBULATORY_CARE_PROVIDER_SITE_OTHER): Payer: 59 | Admitting: Gastroenterology

## 2023-03-19 ENCOUNTER — Encounter (INDEPENDENT_AMBULATORY_CARE_PROVIDER_SITE_OTHER): Payer: Self-pay | Admitting: Gastroenterology

## 2023-03-19 VITALS — BP 100/65 | HR 94 | Temp 98.3°F | Ht 63.0 in | Wt 217.1 lb

## 2023-03-19 DIAGNOSIS — K219 Gastro-esophageal reflux disease without esophagitis: Secondary | ICD-10-CM

## 2023-03-19 DIAGNOSIS — R1032 Left lower quadrant pain: Secondary | ICD-10-CM | POA: Diagnosis not present

## 2023-03-19 NOTE — Patient Instructions (Signed)
Please continue with pantoprazole 40mg  twice daily Be mindful of greasy, spicy, fried, citrus foods, caffeine, carbonated drinks, chocolate and alcohol as these can increase reflux symptoms Stay upright 2-3 hours after eating, prior to lying down and avoid eating late in the evenings.  As far as your left lower quadrant pain, given tenderness on exam, I am concerned for possible diverticulitis, we will get you scheduled for a CT scan to evaluate this further. If you develop worsening of pain, diarrhea, rectal bleeding, fevers/chills, please let me know immediately.  Follow up 1 year

## 2023-03-19 NOTE — Progress Notes (Addendum)
Referring Provider: Tommie Sams, DO Primary Care Physician:  Tommie Sams, DO Primary GI Physician: Levon Hedger   Chief Complaint  Patient presents with   Gastroesophageal Reflux    Follow up on GERD. States protonix is working well.    Abdominal Pain    Having pain on left lower side. Started last Thursday or Friday. Thinks she pulled a muscle but not sure. Pain is worse with movement. Sharp pain.    HPI:   Tammie Sweeney is a 57 y.o. female with past medical history of anxiety/depression, GERD, hiatal hernia, HLD, migraines.    Patient presenting today for follow up of GERD and with abdominal pain  Last seen may 2023, at that time having globus sensation but improved since EGD/change in PPI. No other GERD symptoms, dysphagia also improved.   Recommended continue PPI BID, reflux precautions  Present:  Patient states she is doing pantoprazole 40mg , always takes morning dose, sometimes will miss her evening dose and is still doing well. She notes she is not waking up with any GERD symptoms or globus sensation now. some occasional dysphagia with drier foods like biscuits but she avoids these.   She reports some LLQ pain near her hip bone that seems to only hurts when she moves. No pain radiation. She notes pain began on Thursday, she has taken some ibuprofen which seems to help with her pain. Denies any constipation, diarrhea rectal bleeding, melena, fevers, chills.    Last Colonoscopy:11/24/21 Seven 2 to 10 mm polyps in the rectum, in the descending colon and in the transverse colon-3 tubular adenomas, 2 SSL and 2 hyperplastic polyps - Diverticulosis in the sigmoid colon and in the descending colon. - The distal rectum and anal verge are normal on retroflexion view. Last Endoscopy:11/24/21 No endoscopic esophageal abnormality to explain patient's dysphagia. Esophagus dilated.Dilated. Biopsied-suggestive of reflux disease - 1 cm hiatal hernia. - Gastric mucosal atrophy.  Biopsied-normal - Normal examined duodenum.   Recommendations:  Repeat colonoscopy in 2026   Past Medical History:  Diagnosis Date   Anxiety    Depression    Fracture closed, humerus, shaft 08/02/2011   GERD (gastroesophageal reflux disease)    Hiatal hernia    Hyperlipidemia    Migraine     Past Surgical History:  Procedure Laterality Date   BIOPSY  11/24/2021   Procedure: BIOPSY;  Surgeon: Dolores Frame, MD;  [facility];  Service: Gastroenterology;;   COLONOSCOPY WITH PROPOFOL N/A 11/24/2021   Procedure: COLONOSCOPY WITH PROPOFOL;  Surgeon: Dolores Frame, MD;  Location: [facility];  Service: Gastroenterology;  Laterality: N/A;  205   ESOPHAGEAL DILATION  11/24/2021   Procedure: ESOPHAGEAL DILATION;  Surgeon: Marguerita Merles, Reuel Boom, MD;  Location: [facility];  Service: Gastroenterology;;   ESOPHAGOGASTRODUODENOSCOPY (EGD) WITH PROPOFOL N/A 11/24/2021   Procedure: ESOPHAGOGASTRODUODENOSCOPY (EGD) WITH PROPOFOL;  Surgeon: Dolores Frame, MD;  Location: [facility];  Service: Gastroenterology;  Laterality: N/A;   INCONTINENCE SURGERY     left arm fracture repair     POLYPECTOMY  11/24/2021   Procedure: POLYPECTOMY;  Surgeon: Marguerita Merles, Reuel Boom, MD;  Location: [facility];  Service: Gastroenterology;;   TONSILLECTOMY      Current Outpatient Medications  Medication Sig Dispense Refill   buPROPion HCl ER, XL, 450 MG TB24 Take 1 tablet (450 mg total) by mouth daily. 90 tablet 1   citalopram (CELEXA) 40 MG tablet TAKE ONE TABLET (40MG  TOTAL) BY MOUTH DAILY 90 tablet 1  docusate sodium (COLACE) 50 MG capsule Take by mouth 2 (two) times daily.     levocetirizine (XYZAL) 5 MG tablet TAKE ONE TABLET (5MG  TOTAL) BY MOUTH DAILY 90 tablet 0   lisinopril (ZESTRIL) 20 MG tablet TAKE ONE TABLET (20MG  TOTAL) BY MOUTH DAILY 90 tablet 1   pantoprazole (PROTONIX) 40 MG tablet Take 1 tablet (40 mg total) by mouth 2 (two) times daily.  Prn acid reflux 180 tablet 1   sulfamethoxazole-trimethoprim (BACTRIM DS) 800-160 MG tablet Take 1 tablet by mouth 2 (two) times daily. 180 tablet 3   tiZANidine (ZANAFLEX) 4 MG tablet Take 1 tablet (4 mg total) by mouth every 6 (six) hours as needed for muscle spasms. 30 tablet 0   No current facility-administered medications for this visit.    Allergies as of 03/19/2023 - Review Complete 03/19/2023  Allergen Reaction Noted   Molds & smuts Cough, Nausea Only, Other (See Comments), Photosensitivity, and Shortness Of Breath 04/21/2019   Other Shortness Of Breath, Itching, Other (See Comments), Cough, Hypertension, Nausea And Vomiting, Palpitations, Photosensitivity, and Tinitus 04/21/2019   Pollen extract Cough, Itching, and Shortness Of Breath 03/27/2021   Grass extracts [gramineae pollens] Other (See Comments) 04/21/2019    No family history on file.  Social History   Socioeconomic History   Marital status: Single    Spouse name: Not on file   Number of children: 0   Years of education: Not on file   Highest education level: Not on file  Occupational History   Occupation: DUI educator   Occupation: counselor    Comment: with resolution counseling; worked at [employer] previously  Tobacco Use   Smoking status: Every Day    Current packs/day: 0.50    Average packs/day: 0.5 packs/day for 40.0 years (20.0 ttl pk-yrs)    Types: Cigarettes   Smokeless tobacco: Never   Tobacco comments:    1/2 pack a day since a teenager.   Vaping Use   Vaping status: Never Used  Substance and Sexual Activity   Alcohol use: Yes    Comment: maybe twice per year   Drug use: No   Sexual activity: Yes    Birth control/protection: I.U.D.  Other Topics Concern   Not on file  Social History Narrative   Not on file   Social Determinants of Health   Financial Resource Strain: Not on file  Food Insecurity: No Food Insecurity (09/25/2021)   Received from [facility]   Hunger Vital Sign    Worried  About Running Out of Food in the Last Year: Never true    Ran Out of Food in the Last Year: Never true  Transportation Needs: Not on file  Physical Activity: Not on file  Stress: Not on file  Social Connections: Unknown (12/25/2021)   Received from [employer]   Social Network    Social Network: Not on file    Review of systems General: negative for malaise, night sweats, fever, chills, weight loss Neck: Negative for lumps, goiter, pain and significant neck swelling Resp: Negative for cough, wheezing, dyspnea at rest CV: Negative for chest pain, leg swelling, palpitations, orthopnea GI: denies melena, hematochezia, nausea, vomiting, diarrhea, constipation, dysphagia, odyonophagia, early satiety or unintentional weight loss. +LLQ pain MSK: Negative for joint pain or swelling, back pain, and muscle pain. Derm: Negative for itching or rash Psych: Denies depression, anxiety, memory loss, confusion. No homicidal or suicidal ideation.  Heme: Negative for prolonged bleeding, bruising easily, and swollen nodes. Endocrine: Negative for cold  or heat intolerance, polyuria, polydipsia and goiter. Neuro: negative for tremor, gait imbalance, syncope and seizures. The remainder of the review of systems is noncontributory.  Physical Exam: BP 100/65 (BP Location: Left Arm, Patient Position: Sitting, Cuff Size: Large)   Pulse 94   Temp 98.3 F (36.8 C) (Oral)   Ht 5\' 3"  (1.6 m)   Wt 217 lb 1.6 oz (98.5 kg)   BMI 38.46 kg/m  General:   Alert and oriented. No distress noted. Pleasant and cooperative.  Head:  Normocephalic and atraumatic. Eyes:  Conjuctiva clear without scleral icterus. Mouth:  Oral mucosa pink and moist. Good dentition. No lesions. Heart: Normal rate and rhythm, s1 and s2 heart sounds present.  Lungs: Clear lung sounds in all lobes. Respirations equal and unlabored. Abdomen:  +BS, soft,  and non-distended. TTP of LLQ. No rebound or guarding. No HSM or masses noted. Derm: No  palmar erythema or jaundice Msk:  Symmetrical without gross deformities. Normal posture. Extremities:  Without edema. Neurologic:  Alert and  oriented x4 Psych:  Alert and cooperative. Normal mood and affect.  Invalid input(s): "6 MONTHS"   ASSESSMENT: SHAKEISHA HORINE is a 57 y.o. female presenting today for follow up of GERD and with LLQ pain  GERD: seems improved recently, doing pantoprazole 40mg  usually BID though misses evening dose on occasion and symptoms still seem well managed. Rare dysphagia with drier foods, globus sensation has resolved. Will continue with current PPI regimen/good reflux precautions.  LLQ Pain: patient notes no associated symptoms, pain seems to be only present with movement, however, on exam she has notably tenderness to palpation of LLQ, last Colonoscopy with diverticulosis present, will obtain CT A/P with contrast to rule out diverticulitis. I advised her if she develops diarrhea, rectal bleeding, fevers/chills, worsening of pain, she needs to make me aware.    PLAN:  Continue pantoprazole 40mg  BID  2. Good reflux precautions  3. CT A/P with contrast for LLQ pain  All questions were answered, patient verbalized understanding and is in agreement with plan as outlined above.   Follow Up: 1 year   Tammie Lisby L. Jeanmarie Hubert, MSN, APRN, AGNP-C Adult-Gerontology Nurse Practitioner [facility]  I have reviewed the note and agree with the APP's assessment as described in this progress note  Katrinka Blazing, MD Gastroenterology and Hepatology [facility] Gastroenterology

## 2023-03-22 ENCOUNTER — Ambulatory Visit (HOSPITAL_COMMUNITY)
Admission: RE | Admit: 2023-03-22 | Discharge: 2023-03-22 | Disposition: A | Discharge status: Home / Self Care | Payer: 59 | Source: Ambulatory Visit | Attending: Gastroenterology | Admitting: Gastroenterology

## 2023-03-22 DIAGNOSIS — R1032 Left lower quadrant pain: Secondary | ICD-10-CM | POA: Diagnosis not present

## 2023-03-22 DIAGNOSIS — K5730 Diverticulosis of large intestine without perforation or abscess without bleeding: Secondary | ICD-10-CM | POA: Diagnosis not present

## 2023-03-22 DIAGNOSIS — K449 Diaphragmatic hernia without obstruction or gangrene: Secondary | ICD-10-CM | POA: Diagnosis not present

## 2023-03-22 DIAGNOSIS — K5792 Diverticulitis of intestine, part unspecified, without perforation or abscess without bleeding: Secondary | ICD-10-CM | POA: Diagnosis not present

## 2023-03-22 DIAGNOSIS — K429 Umbilical hernia without obstruction or gangrene: Secondary | ICD-10-CM | POA: Diagnosis not present

## 2023-03-22 MED ORDER — IOHEXOL 300 MG/ML  SOLN
100.0000 mL | Freq: Once | INTRAMUSCULAR | Status: AC | PRN
  Administered 2023-03-22: 100 mL via INTRAVENOUS

## 2023-03-22 MED ORDER — IOHEXOL 9 MG/ML PO SOLN
ORAL | Status: AC
  Filled 2023-03-22: qty 1000

## 2023-03-25 ENCOUNTER — Telehealth (INDEPENDENT_AMBULATORY_CARE_PROVIDER_SITE_OTHER): Payer: Self-pay | Admitting: Gastroenterology

## 2023-03-25 NOTE — Telephone Encounter (Signed)
Spoke to on-call radiologist today at [facility] reviewed the images as there was no final report of these as of today.  He reviewed the films and stated she had diverticulosis but no diverticulitis.  There was mild inflammation in the bladder but no other abnormalities.  Toniann Fail, can you please call the patient and tell the patient the CT scan only showed some mild bladder inflammation?  If she is having urinary symptoms or chest pain when she urinates, lower abdominal pain or urgency to urinate, she should discuss this with her PCP.  Thanks,  Katrinka Blazing, MD Gastroenterology and Hepatology [facility] Gastroenterology

## 2023-03-25 NOTE — Telephone Encounter (Signed)
Left message to return call 

## 2023-03-26 NOTE — Telephone Encounter (Signed)
Discussed with patient per Dr. Levon Hedger -  Spoke to on-call radiologist today at [facility] reviewed the images as there was no final report of these as of today.  He reviewed the films and stated she had diverticulosis but no diverticulitis.  There was mild inflammation in the bladder but no other abnormalities.    the CT scan only showed some mild bladder inflammation?  If she is having urinary symptoms or chest pain when she urinates, lower abdominal pain or urgency to urinate, she should discuss this with her PCP.    Patient verbalized understanding of results.

## 2023-04-01 ENCOUNTER — Ambulatory Visit: Payer: 59 | Admitting: Family Medicine

## 2023-04-01 VITALS — BP 132/89 | HR 107 | Temp 96.7°F | Ht 63.0 in | Wt 217.6 lb

## 2023-04-01 DIAGNOSIS — R300 Dysuria: Secondary | ICD-10-CM | POA: Diagnosis not present

## 2023-04-01 DIAGNOSIS — R109 Unspecified abdominal pain: Secondary | ICD-10-CM

## 2023-04-01 LAB — POCT URINALYSIS DIP (CLINITEK)
Bilirubin, UA: NEGATIVE
Blood, UA: NEGATIVE
Glucose, UA: NEGATIVE mg/dL
Ketones, POC UA: NEGATIVE mg/dL
Leukocytes, UA: NEGATIVE
Nitrite, UA: NEGATIVE
Spec Grav, UA: >=1.03 — AB (ref 1.010–1.025)
Urobilinogen, UA: 0.2 E.U./dL
pH, UA: 6 (ref 5.0–8.0)

## 2023-04-01 NOTE — Progress Notes (Signed)
Subjective:  Patient ID: Tammie Sweeney, female    DOB: [birth year]  Age: 57 y.o. MRN: [id number]  CC: ? UTI   HPI:  57 year old female presents for evaluation of the above.  Patient recently been experiencing left flank pain/left lower quadrant pain.  She was recently seen by GI on 7/23.  Had a CT scan  on 7/26.  She was told that the CT scan showed bladder inflammation.  Advised her to follow-up with me for concern for UTI.  She denies any abdominal pain here today.  Denies urinary symptoms.  Has had intermittent left flank pain.  Of note, CT scan report explicitly states that the bladder is normal.  There was some perinephric fat stranding.  No stones.  Patient Active Problem List   Diagnosis Date Noted   Flank pain 04/01/2023   Left lower quadrant abdominal pain 03/19/2023   Cigarette smoker 01/11/2023   Essential hypertension 06/18/2022   Knee osteoarthritis 06/18/2022   Type 2 diabetes mellitus without complications (HCC) 06/15/2022   History of colonic polyps 01/23/2022   Mixed hyperlipidemia 05/24/2021   Cystic acne 05/24/2021   Migraine 05/24/2021   Gastroesophageal reflux disease 04/24/2021   Sleep apnea 04/24/2021    Social Hx   Social History   Socioeconomic History   Marital status: Single    Spouse name: Not on file   Number of children: 0   Years of education: Not on file   Highest education level: Not on file  Occupational History   Occupation: DUI educator   Occupation: Veterinary surgeon    Comment: with resolution counseling; worked at [employer] previously  Tobacco Use   Smoking status: Every Day    Current packs/day: 0.50    Average packs/day: 0.5 packs/day for 40.0 years (20.0 ttl pk-yrs)    Types: Cigarettes   Smokeless tobacco: Never   Tobacco comments:    1/2 pack a day since a teenager.   Vaping Use   Vaping status: Never Used  Substance and Sexual Activity   Alcohol use: Yes    Comment: maybe twice per year   Drug use: No   Sexual activity: Yes     Birth control/protection: I.U.D.  Other Topics Concern   Not on file  Social History Narrative   Not on file   Social Determinants of Health   Financial Resource Strain: Not on file  Food Insecurity: No Food Insecurity (09/25/2021)   Received from [facility]   Hunger Vital Sign    Worried About Running Out of Food in the Last Year: Never true    Ran Out of Food in the Last Year: Never true  Transportation Needs: Not on file  Physical Activity: Not on file  Stress: Not on file  Social Connections: Unknown (12/25/2021)   Received from [facility]   Social Network    Social Network: Not on file    Review of Systems Per HPI  Objective:  BP 132/89   Pulse (!) 107   Temp (!) 96.7 F (35.9 C)   Ht 5\' 3"  (1.6 m)   Wt 217 lb 9.6 oz (98.7 kg)   SpO2 98%   BMI 38.55 kg/m      04/01/2023    4:09 PM 03/19/2023   11:53 AM 02/18/2023    1:42 PM  BP/Weight  Systolic BP 132 100 116  Diastolic BP 89 65 78  Wt. (Lbs) 217.6 217.1 216.2  BMI 38.55 kg/m2 38.46 kg/m2 38.3 kg/m2  Physical Exam Vitals and nursing note reviewed.  Constitutional:      General: She is not in acute distress.    Appearance: Normal appearance. She is obese.  HENT:     Head: Normocephalic and atraumatic.  Cardiovascular:     Rate and Rhythm: Normal rate and regular rhythm.  Pulmonary:     Effort: Pulmonary effort is normal. No respiratory distress.  Abdominal:     General: There is no distension.     Palpations: Abdomen is soft.     Tenderness: There is no abdominal tenderness.  Neurological:     Mental Status: She is alert.  Psychiatric:        Mood and Affect: Mood normal.        Behavior: Behavior normal.     Lab Results  Component Value Date   WBC 7.6 05/19/2021   HGB 13.6 05/19/2021   HCT 40.6 05/19/2021   PLT 323 05/19/2021   GLUCOSE 126 (H) 05/19/2021   CHOL 259 (H) 05/19/2021   TRIG 310 (H) 05/19/2021   HDL 39 (L) 05/19/2021   LDLCALC 161 (H)  05/19/2021   ALT 25 05/19/2021   AST 18 05/19/2021   NA 141 05/19/2021   K 4.7 05/19/2021   CL 102 05/19/2021   CREATININE 1.01 (H) 05/19/2021   BUN 14 05/19/2021   CO2 23 05/19/2021   TSH 1.168 01/04/2015   HGBA1C 6.6 (H) 05/19/2021     Assessment & Plan:   Problem List Items Addressed This Visit       Other   Flank pain - Primary    CT scan reviewed.  Urine clear here today.  Sending culture.      Relevant Orders   POCT URINALYSIS DIP (CLINITEK) (Completed)   Urine Culture   Everlene Other DO [facility]

## 2023-04-01 NOTE — Assessment & Plan Note (Signed)
CT scan reviewed.  Urine clear here today.  Sending culture.

## 2023-05-31 DIAGNOSIS — R32 Unspecified urinary incontinence: Secondary | ICD-10-CM | POA: Insufficient documentation

## 2023-05-31 DIAGNOSIS — J342 Deviated nasal septum: Secondary | ICD-10-CM | POA: Insufficient documentation

## 2023-05-31 DIAGNOSIS — J039 Acute tonsillitis, unspecified: Secondary | ICD-10-CM | POA: Insufficient documentation

## 2023-07-29 ENCOUNTER — Ambulatory Visit: Payer: 59 | Admitting: Family Medicine

## 2023-07-29 VITALS — BP 146/88 | HR 101 | Ht 63.0 in

## 2023-07-29 DIAGNOSIS — M25561 Pain in right knee: Secondary | ICD-10-CM

## 2023-07-29 MED ORDER — HYDROCODONE-ACETAMINOPHEN 10-325 MG PO TABS: 1.0000 | ORAL_TABLET | ORAL | 0 refills | Status: AC | PRN

## 2023-07-29 NOTE — Progress Notes (Signed)
   Subjective:    Patient ID: Dutch Quint, female    DOB: 25-Oct-1965, 57 y.o.   MRN: [id number]  HPI She relates she was walking up some steps and she felt a loud pop in her knee then the knee became swollen painful hard to walk on Right knee pain Has had problems in the past with knee pain but never like this Denies any other setbacks States Tylenol ibuprofen not helping Has seen [facility] in the past   Review of Systems     Objective:   Physical Exam Ankles some minimal swelling in the lower leg calf nontender swollen right knee difficult to assess ligaments and cartilage because of the pain and stiffness        Assessment & Plan:  Knee injury Recommend consultation with [facility] Possible cartilage or ligament injury Stay off of leg until sees orthopedics Hydrocodone for severe pain half tablet when necessary full tablet when pain is severe Blood pressure elevated recommend follow-up standard visit with Dr. Adriana Simas  Patient will go to [facility] urgent care today

## 2023-08-30 ENCOUNTER — Ambulatory Visit: Payer: 59 | Admitting: Family Medicine

## 2023-09-14 ENCOUNTER — Other Ambulatory Visit: Payer: Self-pay | Admitting: Nurse Practitioner

## 2023-09-21 ENCOUNTER — Other Ambulatory Visit: Payer: Self-pay | Admitting: Family Medicine

## 2023-09-21 DIAGNOSIS — L700 Acne vulgaris: Secondary | ICD-10-CM

## 2023-12-11 ENCOUNTER — Other Ambulatory Visit: Payer: Self-pay | Admitting: Nurse Practitioner

## 2024-02-25 ENCOUNTER — Other Ambulatory Visit: Payer: Self-pay | Admitting: Nurse Practitioner

## 2024-03-15 ENCOUNTER — Other Ambulatory Visit: Payer: Self-pay | Admitting: Nurse Practitioner

## 2024-03-16 ENCOUNTER — Other Ambulatory Visit: Payer: Self-pay

## 2024-03-16 ENCOUNTER — Other Ambulatory Visit: Payer: Self-pay | Admitting: Family Medicine

## 2024-03-16 MED ORDER — LISINOPRIL 20 MG PO TABS: ORAL_TABLET | ORAL | 1 refills | Status: DC

## 2024-03-19 ENCOUNTER — Ambulatory Visit (INDEPENDENT_AMBULATORY_CARE_PROVIDER_SITE_OTHER): Payer: 59 | Admitting: Gastroenterology

## 2024-03-19 ENCOUNTER — Encounter (INDEPENDENT_AMBULATORY_CARE_PROVIDER_SITE_OTHER): Payer: Self-pay | Admitting: Gastroenterology

## 2024-03-19 VITALS — BP 113/75 | HR 106 | Ht 63.0 in | Wt 221.8 lb

## 2024-03-19 DIAGNOSIS — K219 Gastro-esophageal reflux disease without esophagitis: Secondary | ICD-10-CM

## 2024-03-19 DIAGNOSIS — Z860101 Personal history of adenomatous and serrated colon polyps: Secondary | ICD-10-CM

## 2024-03-19 NOTE — Progress Notes (Signed)
 Referring Provider: Cook, Jayce G, DO Primary Care Physician:  Cook, Jayce G, DO Primary GI Physician: Dr. Eartha   Chief Complaint  Patient presents with   Follow-up    Pt arrives for follow up. Pt will need refill on Pantoprazole . Pt is taking them twice daily, she can tell a difference if she does not.    HPI:   Tammie Sweeney is a 58 y.o. female with past medical history of anxiety/depression, GERD, hiatal hernia, HLD, migraines.     Patient presenting today for:  Follow up of GERD  Last seen July 2024, at thatt ime doing well on protonix  40mg , taking 1-2 times per day. Globus sensation improved. Dysphagia only with drier foods like bread. Having some LLQ pain, worse with movement. Ibuprofen  helping some.   Recommended continue PPI BID, CT A/P with contrast for LLQ pain  CT A/P on 03/24/24:  Descending and sigmoid colon diverticulosis without evidence of acute diverticulitis. Hepatic steatosis  Some bladder inflammation (reviewed by on call radiology with Dr. Eartha)  Present:  She has had some issues with insurance covering protonix  BID,  they will only cover 30 days and she pays for the other 30 days out of pocket with Goodrx card which is doable for her right now. GERD well controlled on BID dosing, she does note symptoms if she takes PPI only once daily. No dysphagia, odynophagia, nausea, vomting. Denies rectal bleeding, melena, abdominal pain, changes in appetite or weight loss.   Last Colonoscopy:11/24/21 Seven 2 to 10 mm polyps in the rectum, in the descending colon and in the transverse colon-3 tubular adenomas, 2 SSL and 2 hyperplastic polyps - Diverticulosis in the sigmoid colon and in the descending colon. - The distal rectum and anal verge are normal on retroflexion view. Last Endoscopy:11/24/21 No endoscopic esophageal abnormality to explain patient's dysphagia. Esophagus dilated.Dilated. Biopsied-suggestive of reflux disease - 1 cm hiatal hernia. - Gastric  mucosal atrophy. Biopsied-normal - Normal examined duodenum.   Recommendations:  Repeat colonoscopy in 2026 [facility] Weights   03/19/24 1356  Weight: 221 lb 12.8 oz (100.6 kg)     Past Medical History:  Diagnosis Date   Anxiety    Depression    Fracture closed, humerus, shaft 08/02/2011   GERD (gastroesophageal reflux disease)    Hiatal hernia    Hyperlipidemia    Migraine     Past Surgical History:  Procedure Laterality Date   BIOPSY  11/24/2021   Procedure: BIOPSY;  Surgeon: Eartha Angelia Sieving, MD;  [facility]: [facility];  Service: Gastroenterology;;   COLONOSCOPY WITH PROPOFOL  N/A 11/24/2021   Procedure: COLONOSCOPY WITH PROPOFOL ;  Surgeon: Eartha Angelia Sieving, MD;  Location: [facility];  Service: Gastroenterology;  Laterality: N/A;  205   ESOPHAGEAL DILATION  11/24/2021   Procedure: ESOPHAGEAL DILATION;  Surgeon: Eartha Angelia, Sieving, MD;  Location: [facility];  Service: Gastroenterology;;   ESOPHAGOGASTRODUODENOSCOPY (EGD) WITH PROPOFOL  N/A 11/24/2021   Procedure: ESOPHAGOGASTRODUODENOSCOPY (EGD) WITH PROPOFOL ;  Surgeon: Eartha Angelia Sieving, MD;  Location: [facility];  Service: Gastroenterology;  Laterality: N/A;   INCONTINENCE SURGERY     left arm fracture repair     POLYPECTOMY  11/24/2021   Procedure: POLYPECTOMY;  Surgeon: Eartha Angelia, Sieving, MD;  Location: [facility];  Service: Gastroenterology;;   TONSILLECTOMY      Current Outpatient Medications  Medication Sig Dispense Refill   buPROPion  (WELLBUTRIN  XL) 150 MG 24 hr tablet TAKE 3 TABLETS BY MOUTH EVERY DAY 270 tablet 2  citalopram  (CELEXA ) 40 MG tablet TAKE 1 TABLET BY MOUTH DAILY 90 tablet 2   docusate sodium  (COLACE) 50 MG capsule Take by mouth 2 (two) times daily.     levocetirizine (XYZAL ) 5 MG tablet TAKE ONE TABLET (5MG  TOTAL) BY MOUTH DAILY 90 tablet 0   lisinopril  (ZESTRIL ) 20 MG tablet TAKE ONE TABLET (20MG  TOTAL) BY MOUTH DAILY 90 tablet 1   pantoprazole   (PROTONIX ) 40 MG tablet TAKE 1 TABLET (40 MG TOTAL) BY MOUTH 2 (TWO) TIMES DAILY AS NEEDED FOR ACID REFLUX 180 tablet 1   sulfamethoxazole -trimethoprim  (BACTRIM  DS) 800-160 MG tablet TAKE 1 TABLET BY MOUTH TWICE A DAY 60 tablet 11   tiZANidine  (ZANAFLEX ) 4 MG tablet Take 1 tablet (4 mg total) by mouth every 6 (six) hours as needed for muscle spasms. (Patient not taking: Reported on 03/19/2024) 30 tablet 0   No current facility-administered medications for this visit.    Allergies as of 03/19/2024 - Review Complete 03/19/2024  Allergen Reaction Noted   Molds & smuts Cough, Nausea Only, Other (See Comments), Photosensitivity, and Shortness Of Breath 04/21/2019   Other Shortness Of Breath, Itching, Other (See Comments), Cough, Hypertension, Nausea And Vomiting, Palpitations, Photosensitivity, and Tinitus 04/21/2019   Pollen extract Cough, Itching, and Shortness Of Breath 03/27/2021   Grass extracts [gramineae pollens] Other (See Comments) 04/21/2019    Social History   Socioeconomic History   Marital status: Single    Spouse name: Not on file   Number of children: 0   Years of education: Not on file   Highest education level: Not on file  Occupational History   Occupation: DUI educator   Occupation: counselor    Comment: with resolution counseling; worked at [employer] previously  Tobacco Use   Smoking status: Every Day    Current packs/day: 0.50    Average packs/day: 0.5 packs/day for 40.0 years (20.0 ttl pk-yrs)    Types: Cigarettes   Smokeless tobacco: Never   Tobacco comments:    1/2 pack a day since a teenager.   Vaping Use   Vaping status: Never Used  Substance and Sexual Activity   Alcohol use: Yes    Comment: maybe twice per year   Drug use: No   Sexual activity: Yes    Birth control/protection: I.U.D.  Other Topics Concern   Not on file  Social History Narrative   Not on file   Social Drivers of Health   Financial Resource Strain: Not on file  Food Insecurity: No  Food Insecurity (09/25/2021)   [facility]   Hunger Vital Sign    Within the past 12 months, you worried that your food would run out before you got the money to buy more.: Never true    Within the past 12 months, the food you bought just didn't last and you didn't have money to get more.: Never true  Transportation Needs: Not on file  Physical Activity: Not on file  Stress: Not on file  Social Connections: Unknown (12/25/2021)   Received from [facility]   Social Network    Social Network: Not on file   Review of systems General: negative for malaise, night sweats, fever, chills, weight loss Neck: Negative for lumps, goiter, pain and significant neck swelling Resp: Negative for cough, wheezing, dyspnea at rest CV: Negative for chest pain, leg swelling, palpitations, orthopnea GI: denies melena, hematochezia, nausea, vomiting, diarrhea, constipation, dysphagia, odyonophagia, early satiety or unintentional weight loss.  The remainder of the review of systems  is noncontributory.  Physical Exam: BP 113/75   Pulse (!) 106   Ht 5' 3 (1.6 m)   Wt 221 lb 12.8 oz (100.6 kg)   BMI 39.29 kg/m  General:   Alert and oriented. No distress noted. Pleasant and cooperative.  Head:  Normocephalic and atraumatic. Eyes:  Conjuctiva clear without scleral icterus. Mouth:  Oral mucosa pink and moist. Good dentition. No lesions. Heart: Normal rate and rhythm, s1 and s2 heart sounds present.  Lungs: Clear lung sounds in all lobes. Respirations equal and unlabored. Abdomen:  +BS, soft, non-tender and non-distended. No rebound or guarding. No HSM or masses noted.. Neurologic:  Alert and  oriented x4 Psych:  Alert and cooperative. Normal mood and affect.  Invalid input(s): 6 MONTHS   ASSESSMENT: DICY SMIGEL is a 58 y.o. female presenting today for follow up of GERD  Doing well on twice daily protonix  40mg . Unfortunately she has failed other PPIs/daily dosing of protonix  and her  insurance will only cover 30 pills of protonix  per month, she is using a good rx card to get the other 30 pills and paying OOP, which for now is manageable for her. We have discussed change in PPI therapy if cost becomes too high, she will make me aware if this becomes an issue. As she feels good on protonix  40mg  BID, will continue with current regimen for now.  Last TCS march 2023 with presence of tubular adenomas, advised to repeat again in 3 years, she will be due in march 2026, we will reach out to get her scheduled closer to due time.    PLAN:  -continue protonix  40mg  BID -continue good reflux precautions -repeat TCS march 2026  All questions were answered, patient verbalized understanding and is in agreement with plan as outlined above.   Follow Up: 1 year   Markail Diekman L. Mariette, MSN, APRN, AGNP-C Adult-Gerontology Nurse Practitioner [facility]  I have reviewed the note and agree with the APP's assessment as described in this progress note  Toribio Fortune, MD Gastroenterology and Hepatology [facility]astro[facility]

## 2024-03-19 NOTE — Patient Instructions (Signed)
 We will continue with protonix  40mg  twice daily If you have issues with the cost of this, please let me know Be mindful of greasy, spicy, fried, citrus foods,  caffeine, carbonated drinks, chocolate and alcohol as these can increase reflux symptoms Stay upright 2-3 hours after eating, prior to lying down and avoid eating late in the evenings.  Due for colonoscopy again in march 2026, we will contact you closer to time to schedule this  Follow up 1 year  It was a pleasure to see you today. I want to create trusting relationships with patients and provide genuine, compassionate, and quality care. I truly value your feedback! please be on the lookout for a survey regarding your visit with me today. I appreciate your input about our visit and your time in completing this!    Jiovanna Frei L. Cesiah Westley, MSN, APRN, AGNP-C Adult-Gerontology Nurse Practitioner [facility] at [facility]

## 2024-06-10 ENCOUNTER — Encounter (INDEPENDENT_AMBULATORY_CARE_PROVIDER_SITE_OTHER): Payer: Self-pay | Admitting: Gastroenterology

## 2024-09-13 ENCOUNTER — Other Ambulatory Visit: Payer: Self-pay | Admitting: Family Medicine
